# Patient Record
Sex: Male | Born: 1945 | Race: Black or African American | Hispanic: No | Marital: Married | Smoking: Never smoker
Health system: Southern US, Community
[De-identification: ages and names within clinical notes are randomized; demographics above are authoritative.]

## PROBLEM LIST (undated history)

## (undated) ENCOUNTER — Emergency Department (HOSPITAL_BASED_OUTPATIENT_CLINIC_OR_DEPARTMENT_OTHER): Payer: Medicare HMO

## (undated) DIAGNOSIS — C61 Malignant neoplasm of prostate: Secondary | ICD-10-CM

## (undated) DIAGNOSIS — I10 Essential (primary) hypertension: Secondary | ICD-10-CM

## (undated) HISTORY — PX: OTHER SURGICAL HISTORY: SHX169

## (undated) HISTORY — PX: COLON SURGERY: SHX602

## (undated) HISTORY — PX: PROSTATE BIOPSY: SHX241

---

## 2004-02-29 ENCOUNTER — Ambulatory Visit: Payer: Self-pay | Admitting: Internal Medicine

## 2004-03-12 ENCOUNTER — Ambulatory Visit: Payer: Self-pay | Admitting: Internal Medicine

## 2004-12-03 ENCOUNTER — Ambulatory Visit: Payer: Self-pay | Admitting: Internal Medicine

## 2004-12-09 ENCOUNTER — Ambulatory Visit (HOSPITAL_COMMUNITY): Admission: RE | Admit: 2004-12-09 | Discharge: 2004-12-09 | Payer: Self-pay | Admitting: Internal Medicine

## 2005-03-11 ENCOUNTER — Ambulatory Visit: Payer: Self-pay | Admitting: Internal Medicine

## 2005-03-18 ENCOUNTER — Ambulatory Visit: Payer: Self-pay | Admitting: Internal Medicine

## 2005-07-06 ENCOUNTER — Ambulatory Visit: Payer: Self-pay | Admitting: Internal Medicine

## 2005-09-09 ENCOUNTER — Emergency Department (HOSPITAL_COMMUNITY): Admission: EM | Admit: 2005-09-09 | Discharge: 2005-09-10 | Payer: Self-pay | Admitting: Emergency Medicine

## 2005-09-10 ENCOUNTER — Ambulatory Visit: Payer: Self-pay | Admitting: Internal Medicine

## 2007-01-04 ENCOUNTER — Encounter: Payer: Self-pay | Admitting: *Deleted

## 2007-01-04 DIAGNOSIS — F528 Other sexual dysfunction not due to a substance or known physiological condition: Secondary | ICD-10-CM

## 2007-01-04 DIAGNOSIS — I10 Essential (primary) hypertension: Secondary | ICD-10-CM | POA: Insufficient documentation

## 2007-11-14 ENCOUNTER — Encounter: Admission: RE | Admit: 2007-11-14 | Discharge: 2007-11-14 | Payer: Self-pay | Admitting: Gastroenterology

## 2007-11-20 ENCOUNTER — Encounter: Admission: RE | Admit: 2007-11-20 | Discharge: 2007-11-20 | Payer: Self-pay | Admitting: Gastroenterology

## 2007-12-05 ENCOUNTER — Ambulatory Visit: Payer: Self-pay | Admitting: Surgery

## 2008-01-02 ENCOUNTER — Ambulatory Visit: Payer: Self-pay | Admitting: Surgery

## 2008-02-07 ENCOUNTER — Ambulatory Visit (HOSPITAL_COMMUNITY): Admission: RE | Admit: 2008-02-07 | Discharge: 2008-02-07 | Payer: Self-pay | Admitting: Surgery

## 2008-02-07 ENCOUNTER — Ambulatory Visit: Payer: Self-pay | Admitting: Surgery

## 2008-02-20 ENCOUNTER — Encounter: Admission: RE | Admit: 2008-02-20 | Discharge: 2008-02-20 | Payer: Self-pay | Admitting: Surgery

## 2008-02-20 ENCOUNTER — Ambulatory Visit: Payer: Self-pay | Admitting: Surgery

## 2008-02-27 ENCOUNTER — Encounter: Payer: Self-pay | Admitting: Internal Medicine

## 2008-03-05 ENCOUNTER — Ambulatory Visit: Payer: Self-pay | Admitting: Internal Medicine

## 2008-03-05 DIAGNOSIS — E785 Hyperlipidemia, unspecified: Secondary | ICD-10-CM | POA: Insufficient documentation

## 2008-03-05 DIAGNOSIS — I739 Peripheral vascular disease, unspecified: Secondary | ICD-10-CM | POA: Insufficient documentation

## 2008-03-08 ENCOUNTER — Inpatient Hospital Stay (HOSPITAL_COMMUNITY): Admission: RE | Admit: 2008-03-08 | Discharge: 2008-03-11 | Payer: Self-pay | Admitting: Surgery

## 2008-03-08 ENCOUNTER — Ambulatory Visit: Payer: Self-pay | Admitting: Surgery

## 2008-03-09 ENCOUNTER — Encounter: Payer: Self-pay | Admitting: Surgery

## 2008-04-05 ENCOUNTER — Ambulatory Visit: Payer: Self-pay | Admitting: Surgery

## 2008-04-09 ENCOUNTER — Encounter: Admission: RE | Admit: 2008-04-09 | Discharge: 2008-04-09 | Payer: Self-pay | Admitting: Surgery

## 2008-04-09 ENCOUNTER — Ambulatory Visit: Payer: Self-pay | Admitting: Surgery

## 2008-04-17 ENCOUNTER — Ambulatory Visit: Payer: Self-pay | Admitting: Surgery

## 2008-04-17 ENCOUNTER — Ambulatory Visit (HOSPITAL_COMMUNITY): Admission: RE | Admit: 2008-04-17 | Discharge: 2008-04-17 | Payer: Self-pay | Admitting: Surgery

## 2008-05-07 ENCOUNTER — Ambulatory Visit: Payer: Self-pay | Admitting: Surgery

## 2008-05-16 ENCOUNTER — Telehealth: Payer: Self-pay | Admitting: Gastroenterology

## 2008-05-17 ENCOUNTER — Inpatient Hospital Stay (HOSPITAL_COMMUNITY): Admission: RE | Admit: 2008-05-17 | Discharge: 2008-05-23 | Payer: Self-pay | Admitting: Surgery

## 2008-05-17 ENCOUNTER — Ambulatory Visit: Payer: Self-pay | Admitting: Surgery

## 2008-05-18 ENCOUNTER — Encounter: Payer: Self-pay | Admitting: Surgery

## 2008-05-21 ENCOUNTER — Encounter: Payer: Self-pay | Admitting: Surgery

## 2008-06-11 ENCOUNTER — Ambulatory Visit: Payer: Self-pay | Admitting: Surgery

## 2008-06-12 ENCOUNTER — Ambulatory Visit: Payer: Self-pay | Admitting: Internal Medicine

## 2008-06-12 DIAGNOSIS — J069 Acute upper respiratory infection, unspecified: Secondary | ICD-10-CM | POA: Insufficient documentation

## 2008-06-12 DIAGNOSIS — I498 Other specified cardiac arrhythmias: Secondary | ICD-10-CM

## 2008-06-14 LAB — CONVERTED CEMR LAB
ALT: 14 units/L (ref 0–53)
AST: 17 units/L (ref 0–37)
Alkaline Phosphatase: 93 units/L (ref 39–117)
Basophils Absolute: 0.1 10*3/uL (ref 0.0–0.1)
Basophils Relative: 1.6 % (ref 0.0–3.0)
Bilirubin, Direct: 0.1 mg/dL (ref 0.0–0.3)
CO2: 33 meq/L — ABNORMAL HIGH (ref 19–32)
Chloride: 102 meq/L (ref 96–112)
Cholesterol: 189 mg/dL (ref 0–200)
Eosinophils Absolute: 0.1 10*3/uL (ref 0.0–0.7)
GFR calc non Af Amer: 104 mL/min
HDL: 30 mg/dL — ABNORMAL LOW (ref >39.0)
LDL Cholesterol: 133 mg/dL — ABNORMAL HIGH (ref 0–99)
Lymphocytes Relative: 32.7 % (ref 12.0–46.0)
MCHC: 32.7 g/dL (ref 30.0–36.0)
MCV: 83.8 fL (ref 78.0–100.0)
Neutrophils Relative %: 53.7 % (ref 43.0–77.0)
PSA: 1.68 ng/mL (ref 0.10–4.00)
Platelets: 306 10*3/uL (ref 150–400)
Potassium: 3.9 meq/L (ref 3.5–5.1)
RBC: 4.12 M/uL — ABNORMAL LOW (ref 4.22–5.81)
Sodium: 142 meq/L (ref 135–145)
Total Bilirubin: 0.7 mg/dL (ref 0.3–1.2)
VLDL: 26 mg/dL (ref 0–40)

## 2008-06-25 ENCOUNTER — Ambulatory Visit: Payer: Self-pay | Admitting: Surgery

## 2008-07-30 ENCOUNTER — Ambulatory Visit: Payer: Self-pay | Admitting: Surgery

## 2008-08-20 ENCOUNTER — Ambulatory Visit: Payer: Self-pay | Admitting: Surgery

## 2008-11-26 ENCOUNTER — Ambulatory Visit: Payer: Self-pay | Admitting: Surgery

## 2009-02-25 ENCOUNTER — Ambulatory Visit: Payer: Self-pay | Admitting: Surgery

## 2009-03-16 ENCOUNTER — Emergency Department (HOSPITAL_COMMUNITY): Admission: EM | Admit: 2009-03-16 | Discharge: 2009-03-17 | Payer: Self-pay | Admitting: Emergency Medicine

## 2009-06-25 ENCOUNTER — Ambulatory Visit: Payer: Self-pay | Admitting: Vascular Surgery

## 2009-08-11 IMAGING — CT CT ANGIO PELVIS
2 of 5 series · 16 of 46 positions shown, 18 images · IV contrast (omnipaque)
Comparison: 02/20/2008

CTA ABDOMEN

CLINICAL DATA: Postop AAA.

CT ANGIOGRAPHY ABDOMEN AND PELVIS
TECHNIQUE: Multidetector CT imaging of the abdomen and pelvis was
performed using the standard protocol during bolus administration
of intravenous contrast. Multiplanar reconstructed images including
MIPs were obtained and reviewed to evaluate the vascular anatomy.
Contrast: 100 ml Omnipaque 350

[Series 5: angio · axial · 0.62mm/px · z∈[-414,-32]mm · 13 of 248 slices shown, 15 images]
[im 12/248  soft-tissue]
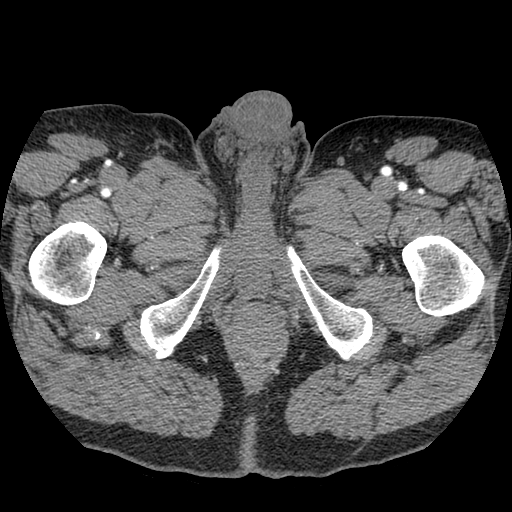
[im 12/248  bone]
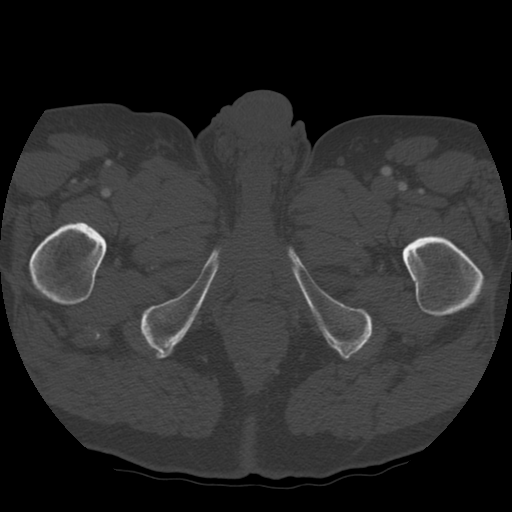
[im 34/248  soft-tissue]
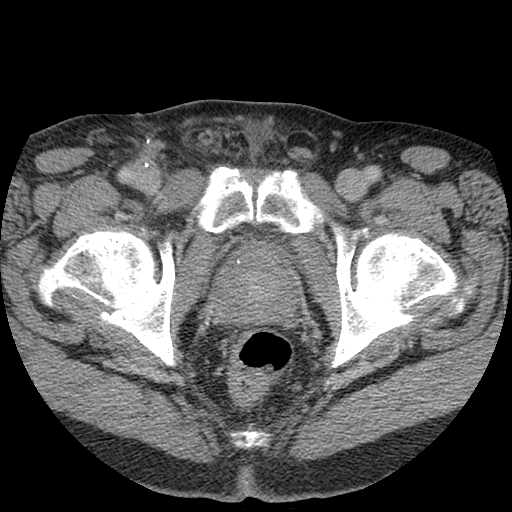
[im 57/248  soft-tissue]
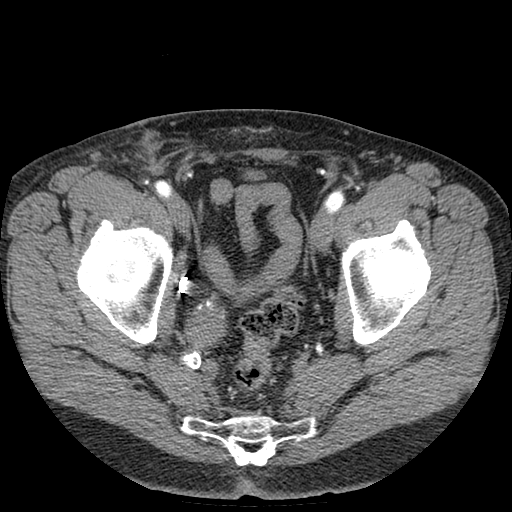
[im 68/248  soft-tissue]
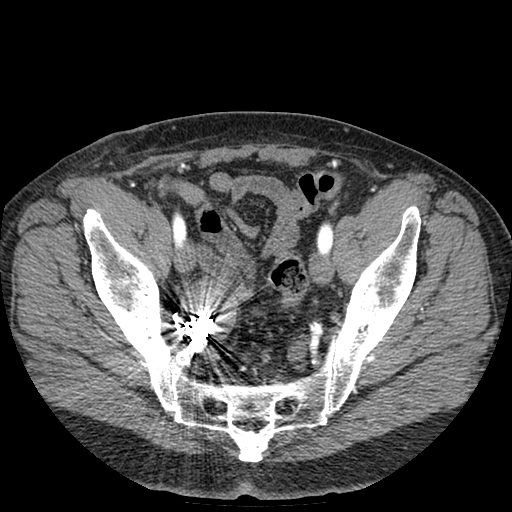
[im 90/248  soft-tissue]
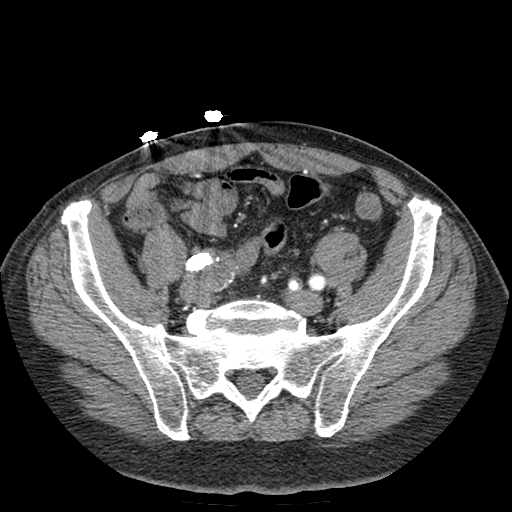
[im 102/248  soft-tissue]
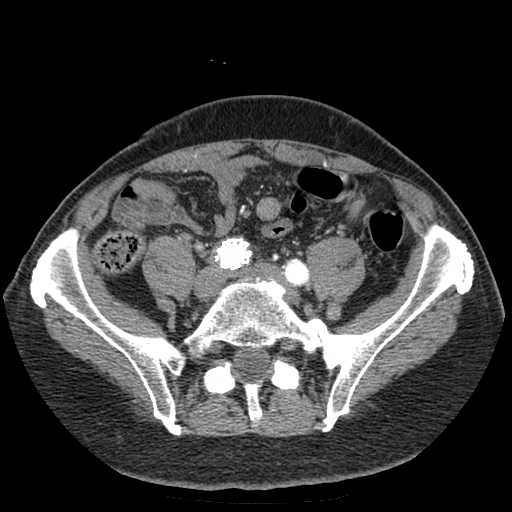
[im 124/248  soft-tissue]
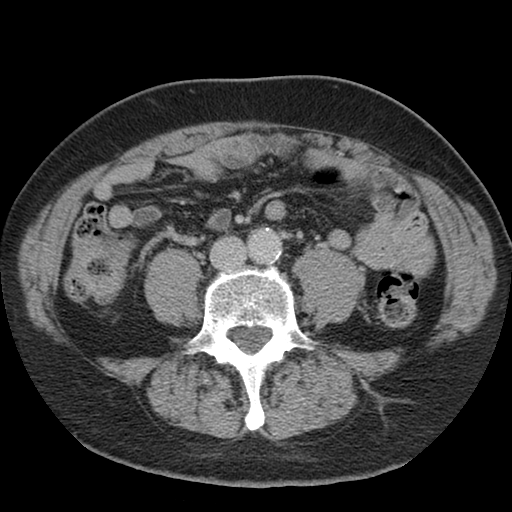
[im 146/248  soft-tissue]
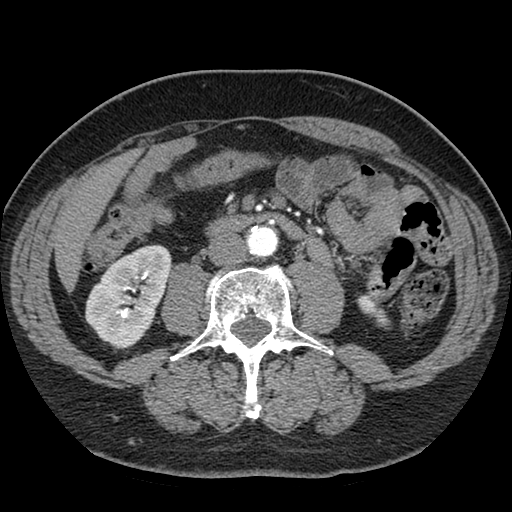
[im 158/248  soft-tissue]
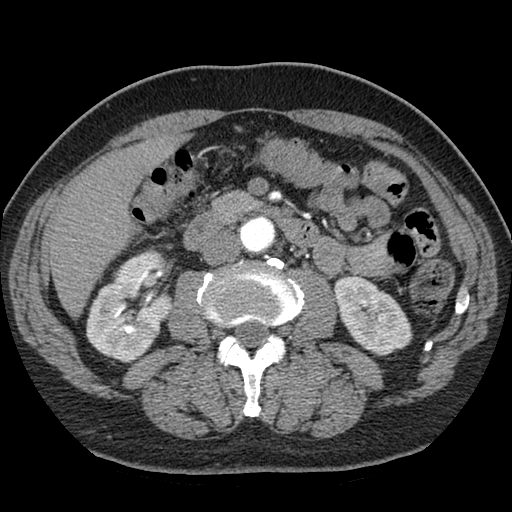
[im 158/248  bone]
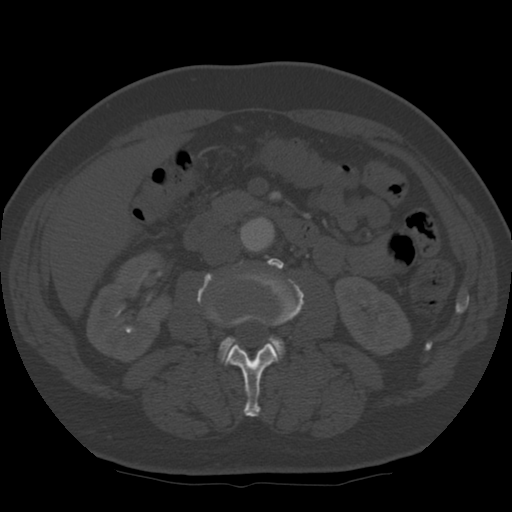
[im 180/248  soft-tissue]
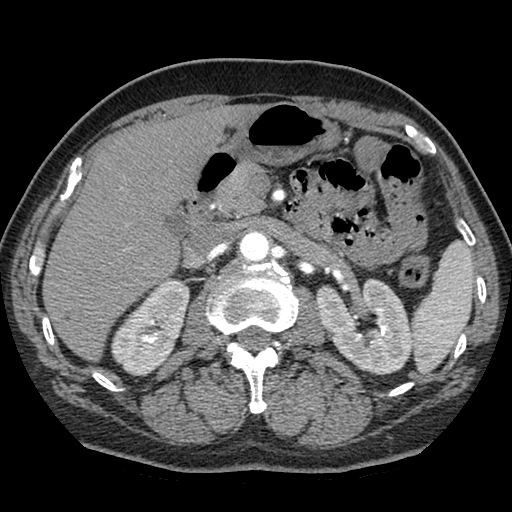
[im 191/248  soft-tissue]
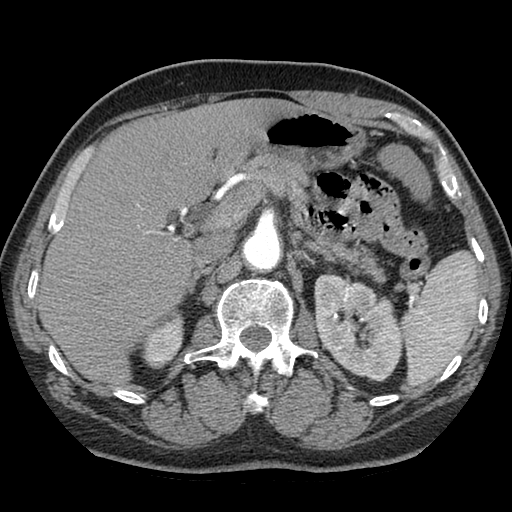
[im 214/248  soft-tissue]
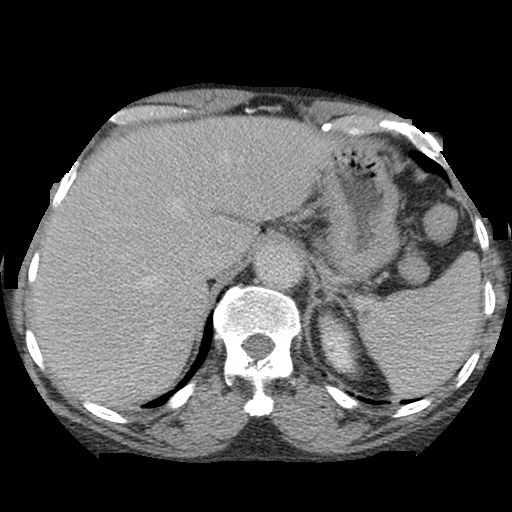
[im 236/248  soft-tissue]
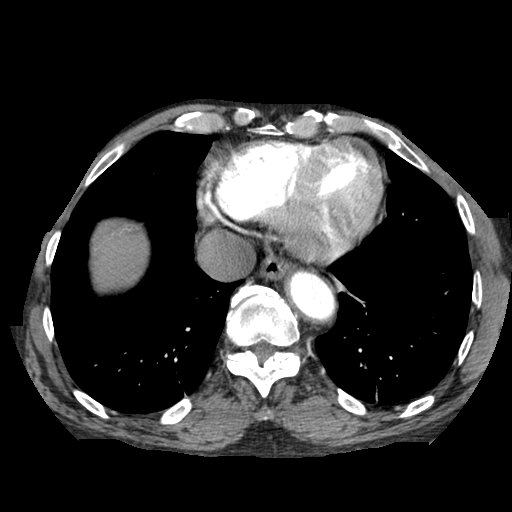

[Series 602: sagittal body · sagittal · 0.90mm/px · 3 of 127 slices shown]
[im 43/127  soft-tissue]
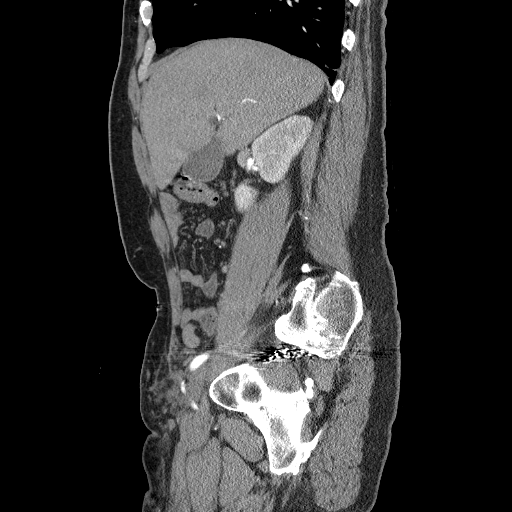
[im 57/127  soft-tissue]
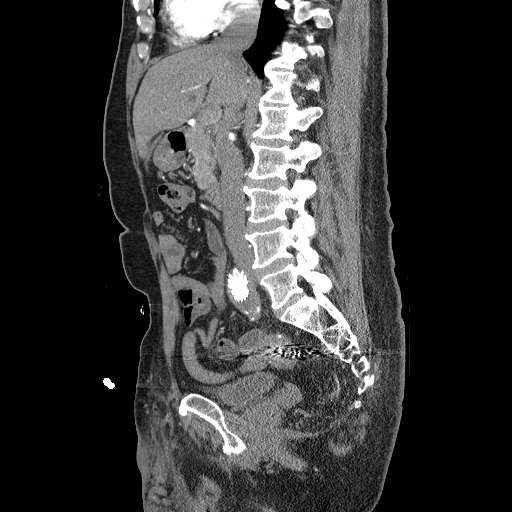
[im 71/127  soft-tissue]
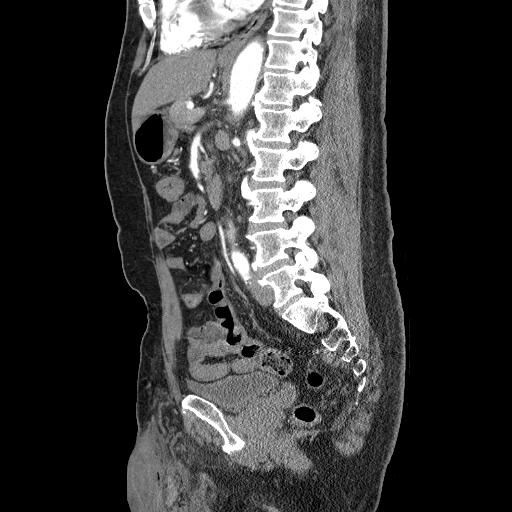

[16 of 46 positions shown; findings below may reference images not displayed]

FINDINGS: Lung bases show subsegmental atelectasis and/or scarring
in the lower lobes.  Heart is at the upper limits of normal in
size.  No pericardial or pleural effusion.

Scattered small low attenuation lesions in the liver are stable,
and likely cysts or hemangiomas, in the absence of known
malignancy. Liver, gallbladder and adrenal glands are otherwise
unremarkable.  There are small nonobstructing stones in the right
kidney.  A 6 mm low attenuation lesion in the left kidney is stable
and likely a cyst.  Spleen, pancreas, stomach and small bowel are
unremarkable.  No pathologically enlarged lymph nodes.  Maximum
diameter of the infrarenal aorta is 2.4 cm.
IMPRESSION: Nonobstructing right nephrolithiasis.

CTA PELVIS
FINDINGS: The patient is status post endovascular repair of a right
common iliac artery aneurysm, which measures 2.5 cm, stable.  There
has been embolization of a right internal iliac artery aneurysm,
with the residual aneurysm sac measuring approximately 3.5 cm,
stable.  A small blush of contrast is seen in the the right
internal iliac artery (image 117).  Right common and external iliac
arteries are patent.

Left internal iliac artery measures up to 1.2 cm.  Left common
iliac artery measures up to 1.3 cm.  Postoperative changes are seen
in the right groin, status post right femoral popliteal artery
bypass.  No postoperative fluid collections.

Colon unremarkable.  Prostate measures 5.4 cm.  No pathologically
enlarged lymph nodes.  No free fluid.  No worrisome lytic or
sclerotic lesions.
IMPRESSION: 1.  Endovascular repair of a right common iliac artery aneurysm
without complicating feature.
2.  Embolization of a right internal iliac artery aneurysm.  A
blush of contrast in the thrombosed internal iliac aneurysm sac may
be due to non embolization of a feeder branch.  Attention on follow-
up exams is warranted.
3.  Postoperative changes in the right groin after right femoral
popliteal artery bypass.
4.  Enlarged prostate.

## 2010-02-25 ENCOUNTER — Emergency Department (HOSPITAL_COMMUNITY): Admission: EM | Admit: 2010-02-25 | Discharge: 2010-02-25 | Payer: Self-pay | Admitting: Emergency Medicine

## 2010-03-03 ENCOUNTER — Ambulatory Visit: Payer: Self-pay | Admitting: Internal Medicine

## 2010-03-03 ENCOUNTER — Encounter (INDEPENDENT_AMBULATORY_CARE_PROVIDER_SITE_OTHER): Payer: Self-pay | Admitting: *Deleted

## 2010-03-03 DIAGNOSIS — M79609 Pain in unspecified limb: Secondary | ICD-10-CM | POA: Insufficient documentation

## 2010-03-03 DIAGNOSIS — S1093XA Contusion of unspecified part of neck, initial encounter: Secondary | ICD-10-CM

## 2010-03-03 DIAGNOSIS — S0083XA Contusion of other part of head, initial encounter: Secondary | ICD-10-CM

## 2010-03-03 DIAGNOSIS — S0003XA Contusion of scalp, initial encounter: Secondary | ICD-10-CM | POA: Insufficient documentation

## 2010-05-08 NOTE — Assessment & Plan Note (Signed)
Summary: post [place]/#/cd--last ov 2010   Vital Signs:  Patient profile:   65 year old male Height:      71 inches Weight:      180.50 pounds BMI:     25.27 O2 Sat:      95 % on Room air Temp:     97.9 degrees F oral Pulse rate:   82 / minute BP sitting:   112 / 72  (left arm) Cuff size:   regular  Vitals Entered By: Zella Ball Ewing CMA Duncan Dull) (March 03, 2010 1:23 PM)  O2 Flow:  Room air  Preventive Care Screening  Colonoscopy:    Date:  02/03/2010    Results:  normal per pt at [facility]   CC: Post Hospital/RE   CC:  Post Hospital/RE.  History of Present Illness: here to f/u after being seen in the ER after an unfortunate slip and fall; striking the right hand adn wrist; as well as right temple with initial sweling adn contusion noted;  asked to f/u here by the ER;  normally seen at the [facility] for usual care, not really trying to follow lower chol diet at all;  Pt denies CP, worsening sob, doe, wheezing, orthopnea, pnd, worsening LE edema, palps, dizziness or syncope  Pt denies new neuro symptoms such as headache, facial or extremity weakness Pt denies polydipsia, polyuria   Overall good compliance with meds, wt stable, little excercise however .  No fever, wt loss, night sweats, loss of appetite or other constitutional symptoms No overt bleeding or other bruising.  Denies worsening depressive symptoms, suicidal ideation, or panic.    Preventive Screening-Counseling & Management      Drug Use:  no.    Problems Prior to Update: 1)  Contusion, Head  (ICD-920) 2)  Hand Pain, Right  (ICD-729.5) 3)  Supraventricular Tachycardia  (ICD-427.89) 4)  Uri  (ICD-465.9) 5)  Preventive Health Care  (ICD-V70.0) 6)  Hyperlipidemia  (ICD-272.4) 7)  Pvd  (ICD-443.9) 8)  Erectile Dysfunction  (ICD-302.72) 9)  Hypertension  (ICD-401.9)  Medications Prior to Update: 1)  Diovan Hct 160-12.5 Mg  Tabs (Valsartan-Hydrochlorothiazide) .Marland Kitchen.. 1 Once Daily 2)  Adult Aspirin Low Strength 81 Mg  Tbdp  (Aspirin) .Marland Kitchen.. 1 Once Daily 3)  Multivitamins   Tabs (Multiple Vitamin) .Marland Kitchen.. 1 Once Daily 4)  Viagra 100 Mg  Tabs (Sildenafil Citrate) .... As Needed 5)  Oxycodone-Acetaminophen 5-325 Mg Tabs (Oxycodone-Acetaminophen) .Marland Kitchen.. 1 Every 4 Hours As Needed For Pain 6)  Hydrochlorothiazide 25 Mg Tabs (Hydrochlorothiazide) .Marland Kitchen.. 1 By Mouth Once Daily 7)  Simvastatin 80 Mg Tabs (Simvastatin) .Marland Kitchen.. 1 By Mouth Once Daily  Current Medications (verified): 1)  Diovan Hct 160-12.5 Mg  Tabs (Valsartan-Hydrochlorothiazide) .Marland Kitchen.. 1 Once Daily 2)  Adult Aspirin Low Strength 81 Mg  Tbdp (Aspirin) .Marland Kitchen.. 1 Once Daily 3)  Multivitamins   Tabs (Multiple Vitamin) .Marland Kitchen.. 1 Once Daily 4)  Viagra 100 Mg  Tabs (Sildenafil Citrate) .... As Needed  Allergies (verified): 1)  ! Pravachol  Past History:  Past Medical History: Last updated: 03/05/2008 Hyperlipidemia Hypertension c-spine DJD E.D.  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   Head:  normocephalic and atraumatic.   Eyes:  No corneal or conjunctival inflammation noted. EOMI. Perrla. Ears:  R ear normal and L ear normal.   Nose:  no external deformity and no nasal discharge.   Mouth:  no gingival abnormalities and pharynx pink and moist.   Neck:  supple and no masses.   Lungs:  normal respiratory effort and normal breath sounds.   Heart:  normal rate and regular rhythm.   Msk:  right wrist with  only slightly decrased ROM and swelling, right hand trace swelling diffusely with mild contusion as well, no laceration and o/w neurovasc intact;  right temple with several day old mild contusion as well  Extremities:  no edema, no erythema  Neurologic:  cranial nerves II-XII intact, strength normal in all extremities, and gait normal.     Impression & Recommendations:  Problem # 1:  HAND PAIN, RIGHT (ICD-729.5) Assessment New wrist and hand contusion , functionally improving, and neurovasc intact - ok to follow as is;  note for work given -   Problem # 2:  CONTUSION, HEAD (ICD-920) Assessment: New mild, neuro exam ok;  no further eval needed  Problem # 3:  HYPERTENSION (ICD-401.9) Assessment: Unchanged  The following medications were removed from the medication list:    Hydrochlorothiazide 25 Mg Tabs (Hydrochlorothiazide) .Marland Kitchen... 1 by mouth once daily His updated medication list for this problem includes:    Diovan Hct 160-12.5 Mg Tabs (Valsartan-hydrochlorothiazide) .Marland Kitchen... 1 once daily  BP today: 112/72 Prior BP: 140/68 (06/12/2008)  Labs Reviewed: K+: 3.9 (06/12/2008) Creat: : 0.8 (06/12/2008)   Chol: 189 (06/12/2008)   HDL: 30.0 (06/12/2008)   LDL: 133 (06/12/2008)   TG: 131 (06/12/2008) stable overall by hx and exam, ok to continue meds/tx as is   Problem # 4:  HYPERLIPIDEMIA (ICD-272.4)  The following medications were removed from the medication list:    Simvastatin 80 Mg Tabs (Simvastatin) .Marland Kitchen... 1 by mouth once daily  Labs Reviewed: SGOT: 17 (06/12/2008)   SGPT: 14 (06/12/2008)   HDL:30.0 (06/12/2008)  LDL:133 (06/12/2008)  Chol:189 (06/12/2008)  Trig:131 (06/12/2008) d/w pt - goal LDL < 70 given the PVD, but he declines statin, [facility]  Complete Medication List: 1)  Diovan Hct 160-12.5 Mg Tabs (Valsartan-hydrochlorothiazide) .Marland Kitchen.. 1 once daily 2)  Adult Aspirin Low Strength 81 Mg Tbdp (Aspirin) .Marland Kitchen.. 1 once daily 3)  Multivitamins Tabs (Multiple  vitamin) .Marland Kitchen.. 1 once daily 4)  Viagra 100 Mg Tabs (Sildenafil citrate) .... As needed  Patient Instructions: 1)  Continue all previous medications as before this visit  2)  you are given the work note 3)  Please schedule a follow-up appointment as needed.   Orders Added: 1)  Est. Patient Level IV [65784]

## 2010-05-08 NOTE — Letter (Signed)
Summary: Out of Work  [facility]  [address] [place], [place] [address]   Phone: [phone]  Fax: [phone]    March 03, 2010   Employee:  Joshua Cain    To Whom It May Concern:   For Medical reasons, please excuse the above named employee from work for the following dates:  Start:   02/25/2010  End:   03/04/2010  If you need additional information, please feel free to contact our office.         Sincerely,    Dr. Oliver Barre

## 2010-06-17 LAB — POCT CARDIAC MARKERS
CKMB, poc: 1.1 ng/mL (ref 1.0–8.0)
CKMB, poc: 3.6 ng/mL (ref 1.0–8.0)
Myoglobin, poc: 409 ng/mL (ref 12–200)
Myoglobin, poc: >500 ng/mL (ref 12–200)

## 2010-06-17 LAB — BASIC METABOLIC PANEL
BUN: 18 mg/dL (ref 6–23)
Chloride: 105 mEq/L (ref 96–112)
GFR calc non Af Amer: >60 mL/min (ref >60)
Glucose, Bld: 122 mg/dL — ABNORMAL HIGH (ref 70–99)
Potassium: 3.2 mEq/L — ABNORMAL LOW (ref 3.5–5.1)
Sodium: 141 mEq/L (ref 135–145)

## 2010-06-17 LAB — CK TOTAL AND CKMB (NOT AT ARMC)
CK, MB: 9.4 ng/mL (ref 0.3–4.0)
Total CK: 534 U/L — ABNORMAL HIGH (ref 7–232)

## 2010-06-17 LAB — CBC
HCT: 41.8 % (ref 39.0–52.0)
Hemoglobin: 14.3 g/dL (ref 13.0–17.0)
MCHC: 34.2 g/dL (ref 30.0–36.0)
MCV: 87.8 fL (ref 78.0–100.0)
RDW: 13.2 % (ref 11.5–15.5)

## 2010-07-08 LAB — URINE MICROSCOPIC-ADD ON

## 2010-07-08 LAB — URINALYSIS, ROUTINE W REFLEX MICROSCOPIC
Bilirubin Urine: NEGATIVE
Specific Gravity, Urine: 1.017 (ref 1.005–1.030)
Urobilinogen, UA: 0.2 mg/dL (ref 0.0–1.0)

## 2010-07-21 LAB — POCT I-STAT, CHEM 8
Creatinine, Ser: 1.1 mg/dL (ref 0.4–1.5)
HCT: 36 % — ABNORMAL LOW (ref 39.0–52.0)
Hemoglobin: 12.2 g/dL — ABNORMAL LOW (ref 13.0–17.0)
Potassium: 4 mEq/L (ref 3.5–5.1)
Sodium: 139 mEq/L (ref 135–145)

## 2010-07-22 LAB — POCT I-STAT 4, (NA,K, GLUC, HGB,HCT)
Glucose, Bld: 109 mg/dL — ABNORMAL HIGH (ref 70–99)
HCT: 28 % — ABNORMAL LOW (ref 39.0–52.0)
Hemoglobin: 9.5 g/dL — ABNORMAL LOW (ref 13.0–17.0)
Potassium: 3 mEq/L — ABNORMAL LOW (ref 3.5–5.1)
Sodium: 138 mEq/L (ref 135–145)

## 2010-07-22 LAB — CBC
HCT: 24 % — ABNORMAL LOW (ref 39.0–52.0)
Hemoglobin: 10.8 g/dL — ABNORMAL LOW (ref 13.0–17.0)
Hemoglobin: 13.1 g/dL (ref 13.0–17.0)
MCHC: 33.3 g/dL (ref 30.0–36.0)
MCHC: 33.3 g/dL (ref 30.0–36.0)
Platelets: 204 10*3/uL (ref 150–400)
RBC: 3.87 MIL/uL — ABNORMAL LOW (ref 4.22–5.81)
RBC: 4.74 MIL/uL (ref 4.22–5.81)
RDW: 15.2 % (ref 11.5–15.5)
WBC: 4.7 10*3/uL (ref 4.0–10.5)
WBC: 4.9 10*3/uL (ref 4.0–10.5)
WBC: 9.1 10*3/uL (ref 4.0–10.5)

## 2010-07-22 LAB — COMPREHENSIVE METABOLIC PANEL
ALT: 14 U/L (ref 0–53)
AST: 18 U/L (ref 0–37)
Alkaline Phosphatase: 101 U/L (ref 39–117)
CO2: 28 mEq/L (ref 19–32)
Chloride: 105 mEq/L (ref 96–112)
GFR calc non Af Amer: >60 mL/min (ref >60)
Glucose, Bld: 96 mg/dL (ref 70–99)
Potassium: 4.2 mEq/L (ref 3.5–5.1)
Sodium: 141 mEq/L (ref 135–145)
Total Bilirubin: 0.6 mg/dL (ref 0.3–1.2)

## 2010-07-22 LAB — BASIC METABOLIC PANEL
BUN: 6 mg/dL (ref 6–23)
CO2: 28 mEq/L (ref 19–32)
Calcium: 7.4 mg/dL — ABNORMAL LOW (ref 8.4–10.5)
Calcium: 8.4 mg/dL (ref 8.4–10.5)
Chloride: 109 mEq/L (ref 96–112)
Creatinine, Ser: 0.71 mg/dL (ref 0.4–1.5)
Creatinine, Ser: 0.84 mg/dL (ref 0.4–1.5)
GFR calc Af Amer: >60 mL/min (ref >60)
GFR calc non Af Amer: >60 mL/min (ref >60)
Glucose, Bld: 121 mg/dL — ABNORMAL HIGH (ref 70–99)
Sodium: 142 mEq/L (ref 135–145)

## 2010-07-22 LAB — POCT I-STAT 3, ART BLOOD GAS (G3+)
Bicarbonate: 26.3 mEq/L — ABNORMAL HIGH (ref 20.0–24.0)
Patient temperature: 98.1
TCO2: 27 mmol/L (ref 0–100)
pCO2 arterial: 38 mmHg (ref 35.0–45.0)
pH, Arterial: 7.447 (ref 7.350–7.450)

## 2010-07-22 LAB — URINALYSIS, ROUTINE W REFLEX MICROSCOPIC
Glucose, UA: NEGATIVE mg/dL
Hgb urine dipstick: NEGATIVE
Ketones, ur: NEGATIVE mg/dL
Protein, ur: NEGATIVE mg/dL
pH: 6 (ref 5.0–8.0)

## 2010-07-22 LAB — PROTIME-INR: Prothrombin Time: 14 seconds (ref 11.6–15.2)

## 2010-07-22 LAB — HEPARIN LEVEL (UNFRACTIONATED): Heparin Unfractionated: <0.1 IU/mL — ABNORMAL LOW (ref 0.30–0.70)

## 2010-07-22 LAB — TYPE AND SCREEN

## 2010-08-19 NOTE — Procedures (Signed)
DUPLEX DEEP VENOUS EXAM - LOWER EXTREMITY   INDICATION:  Pain.   HISTORY:  Edema:  Yes.  Trauma/Surgery:  Right greater saphenous vein ablation.  Pain:  No.  PE:  No.  Previous DVT:  None.  Anticoagulants:  No.  Other:  No.   DUPLEX EXAM:                CFV   SFV   PopV  PTV    GSV                R  L  R  L  R  L  R   L  R  L  Thrombosis    o  o  o     o     o  Spontaneous   +  +  +     +     +  Phasic        +  +  +     +     +  Augmentation  +  +  +     +     +  Compressible  +  +  +     +     +  Competent     +  +  +     +     +   Legend:  + - yes  o - no  p - partial  D - decreased   IMPRESSION:  No evidence of deep venous thrombosis in the right leg.    _____________________________  Quita Skye Hart [place], M.D.   CB/MEDQ  D:  06/25/2009  T:  06/26/2009  Job:  [id number]

## 2010-08-19 NOTE — Op Note (Signed)
NAME:  Joshua Cain, Joshua Cain               ACCOUNT NO.:  [id number]   MEDICAL RECORD NO.:  [id number]          PATIENT TYPE:  AMB   LOCATION:  SDS                          FACILITY:  [facility]   PHYSICIAN:  Juleen China IV, MDDATE OF BIRTH:  Jan 25, 1946   DATE OF PROCEDURE:  04/17/2008  DATE OF DISCHARGE:  04/17/2008                               OPERATIVE REPORT   PREOPERATIVE DIAGNOSIS:  Right leg claudication.   POSTOPERATIVE DIAGNOSIS:  Right leg claudication.   PROCEDURE PERFORMED:  1. Ultrasound-guided access, left common femoral artery.  2. Abdominal aortogram.  3. Right lower extremity runoff.  4. Second order catheterization.   INDICATIONS:  This is a 65 year old gentleman with persistent sciatic  artery.  He has undergone embolization of his sciatic artery aneurysm  and stent graft into his common and external iliac artery on the right.  At the same time, he underwent right femoral to below-knee popliteal  bypass graft with vein.  He comes back in the office and is complaining  of claudication.  Ultrasound reveals decreased ankle-brachial indices.  He comes in today for arteriogram.   PROCEDURE:  The patient identified in the holding are and taken to room  #80.  He was placed supine on the table.  The bilateral groins were  prepped and draped in the standard sterile fashion.  Time-out was  called.  Left femoral artery was evaluated with ultrasound and found to  be widely patent.  Lidocaine 1% was used for local anesthesia.  Left  femoral artery was accessed under ultrasound guidance with an 18-gauge  needle.  A 0.035 Bentson wire was advanced in a retrograde fashion into  the abdominal aorta under fluoroscopic visualization.  A 5-French sheath  was placed.  Over the wire, an Omni flush catheter was placed just above  the aortic bifurcation and aortic angiogram was obtained.  Next, using  the Omni flush catheter and Glidewire, the aortic bifurcation was  crossed.  Cath was  placed in the right external iliac artery and right  lower extremity runoff was obtained.   FINDINGS:  Aortogram:  Stent graft is situated in good position at the  origin of the right common iliac artery.  Coils are visualized within  the right sciatic artery.  There is no filling of the aneurysm sac.  Right external iliac artery is patent in its visualized portions.   Right lower extremity:  The right common femoral artery is patent.  The  right profunda femoral artery is patent with multiple collaterals.  The  right femoral to popliteal bypass graft is occluded.  Collaterals from  the profunda femoral artery reconstitute the below-knee popliteal  artery.  Difficult to evaluate the tibial vessels, however, the  posterior tibial and anterior tibial artery appear patent.   After above images were obtained, decision was made to terminate the  procedure.  Catheters and wires were removed.  The patient will be taken  to the holding area for sheath pull.   IMPRESSION:  1. Successful embolization and exclusion of sciatic artery aneurysm.  2. Occluded femoral popliteal bypass graft.  ______________________________  V. Charlena Cross, MD  Electronically Signed     VWB/MEDQ  D:  04/17/2008  T:  04/17/2008  Job:  [id number]

## 2010-08-19 NOTE — Assessment & Plan Note (Signed)
OFFICE VISIT   JESTEN, CAPPUCCIO  DOB:  1945/08/09                                       02/20/2008  [id number]#:[id number]   REASON FOR VISIT:  Follow up persistent sciatic artery.   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  He was taken for  arteriogram to perform what was going to be part of a 2-stage procedure  to treat his hypogastric aneurysm.  Upon his diagnostic arteriogram, he  was found have a persistent sciatic artery, which precluded embolization  of his hypogastric artery.  I had sent him for CT scan as well as vein  mapping prior to today's visit.  He comes in today for further  discussions.  He has had no change in his symptoms since I last saw him.   PHYSICAL EXAMINATION:  His blood pressure is 136/79, pulse 106,  temperature 98.6.  He is well appearing, no distress.  Cardiovascular:  Regular rate and rhythm.  No murmurs or gallops.  Pulmonary:  Clear  bilaterally.  Abdomen:  Soft, nontender.  Extremities are well perfused.   PAST MEDICAL HISTORY:  Hypertension.   PAST SURGICAL HISTORY:  Left inguinal hernia.   FAMILY HISTORY:  Negative for cardiovascular disease.   SOCIAL:  He is married with 2 children.  Does not smoke and never  smoked.   MEDICATIONS:  Include hydrochlorothiazide/lisinopril.  Alprazolam p.r.n.   ALLERGIES:  None.   DIAGNOSTIC STUDIES:  Vein mapping:  The patient's lower extremity veins  are suboptimal for bypass.  His cephalic veins are marked bilaterally.  The right measures 0.31-0.48, the left measures 0.31-0.36 below the  antecubital crease.  He has a narrowing above the antecubital crease.   CT scan:  I have extensively reviewed a CT scan.   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   PLAN:  I had a long conversation discussing the multiple options.  I  think our  best option is going to be ligation of his right hypogastric  artery aneurysm and then proceeding with below knee popliteal artery  bypass with cephalic vein from his right arm and possibly from his right  leg as a spliced conduit.  At the time of ligation of his aneurysm, I  could potentially do a common iliac to his sciatic artery bypass which  would alleviate having to perform a distal bypass.  That decision will  need to be made in the operating room.  The patient understands all  this.  His operation has been scheduled for Thursday, December 3rd.  The  patient is to have a Cardiolite study prior to his operation.   Jorge Ny, MD  Electronically Signed   VWB/MEDQ  D:  02/20/2008  T:  02/21/2008  Job:  [id number]   cc:   Jordan Hawks. Elnoria Howard, MD  Osvaldo Shipper. Spruill, M.D.

## 2010-08-19 NOTE — Assessment & Plan Note (Signed)
OFFICE VISIT   YANG, RACK  DOB:  Aug 24, 1945                                       06/25/2009  [id number]#:[id number]   HISTORY OF PRESENT ILLNESS:  Mr. Brandenburg is an 65 year old gentleman with  right fem-pop occlusive disease who had a right fem-pop bypass with  greater saphenous vein in January of 2010.  This occluded and he had  right fem-pop with arm pain, which also occluded in February of 2010.  Three days later this occluded, and he had a right below-knee pop with 6  mm Propaten graft.  His last ABIs in November of 2010 showed that the  bypass graft was occluded.  He had ABIs of 0.63 on the right and 0.95 on  the left with a palpable DP pulse on the left.   The patient states that last Friday he was getting out of his car and  then when he went to step on his right leg, he felt like he would give  way.  He had a couple days of some leg swelling, which would be  decreased in the morning after he slept overnight.  He denies any calf  tightness.  He states he had some thigh weakness and tiredness after  walking approximately 4 blocks.  He says he has difficulty sleeping on  one side or the other but has no night or rest pain.  He denies any hip  or buttock pain.  He has no lesions on his right lower extremity.  His  feet are not numb or tingling or cold.  Of note, the patient also had  embolization of a right hypogastric artery aneurysm and right common  iliac artery aneurysm.   VASCULAR LAB:  Repeat ABIs were essentially unchanged with 0.7 on the  right and 0.9 on the left.  He had monophasic signal on the posterior  tib and dorsalis pedis on the right and a palpable left DP.   He also had an ultrasound to rule out DVT, and this was also negative.   PHYSICAL EXAMINATION:  This is a well-developed, well-nourished  gentleman in no acute distress.  His heart rate was 82.  His sats were  98%.  His respiratory rate was 12.  He walked with a slight limp when  he  first got up from a chair.  Bilateral lower extremities were of equal  temperature and warm.  He had some tenderness and paresthesias in the  medial thigh but no calf pain.  He had 1+ pitting edema on the right  side.  His right knee was also examined.  He had negative McMurray's.  He had negative joint line pain and negative patella femoral  compression.  He had good and equal sensation.  He had no open wounds on  either lower extremity.  All his wounds were healing well.  There was no  redness or erythema around any of the wounds.   ASSESSMENT:  Right leg fatigue and claudication with swelling in the  dependent position in the right lower extremity.  Deep venous thrombosis  and decreased arterial flow were tested for today.  Ultrasound for deep  venous thrombosis was negative.  ABIs were stable.   PLAN:  The patient was advised to look for any changes in temperature,  sensation, motion of his right lower extremity as well as increased  pain.  If any of these symptoms should occur, we will see him again and  do further evaluation.   Della Goo, PA-C   Quita Skye. Hart [place], M.D.  Electronically Signed   RR/MEDQ  D:  06/25/2009  T:  06/26/2009  Job:  [id number]

## 2010-08-19 NOTE — Procedures (Signed)
BYPASS GRAFT EVALUATION   INDICATION:  Followup right femoral to popliteal artery bypass graft.   HISTORY:  Diabetes:  No  Cardiac:  No  Hypertension:  Yes  Smoking:  No  Previous Surgery:  Right redo of femoral to popliteal artery bypass  graft on 05/18/2008 by Dr. Myra Gianotti.   SINGLE LEVEL ARTERIAL EXAM                               RIGHT              LEFT  Brachial:                    143                147  Anterior tibial:             88                 133  Posterior tibial:            93                 140  Peroneal:  Ankle/brachial index:        0.63               0.95   PREVIOUS ABI:  Date: 11/26/2008  RIGHT:  0.96  LEFT:  1.12   LOWER EXTREMITY BYPASS GRAFT DUPLEX EXAM:   DUPLEX:  Duplex of right femoral to below popliteal artery bypass graft  appears occluded.   IMPRESSION:  1. Significant drop in right ankle-brachial index with monophasic      waveforms that suggest moderate arterial disease.  2. Stable left ankle-brachial index from previous study.  3. Occluded right femoral to popliteal artery bypass graft.    ___________________________________________  V. Charlena Cross, MD   CB/MEDQ  D:  02/25/2009  T:  02/26/2009  Job:  [id number]

## 2010-08-19 NOTE — Assessment & Plan Note (Signed)
OFFICE VISIT   ELLARD, NAN  DOB:  [birth year]                                       05/07/2008  [id number]#:[id number]   This is a 65 year old gentleman who initially presented with a  persistent sciatic artery and aneurysmal degeneration of his right  internal iliac artery.  On December 3 he underwent endovascular  exclusion of his iliac aneurysm via coil embolization of his hypogastric  artery and persistent sciatic artery and covered stenting of his iliac  arterial system.  Following this procedure he underwent right femoral to  below knee popliteal artery bypass graft with ipsilateral nonreversed  greater saphenous vein.  He had an uncomplicated postoperative course  and was discharged home soon after his procedure.   When I saw him in the office his ankle brachial indices on the right  were 0.6 compared to 1.0 on the left.  Duplex was concerning for  tortuosity/stenosis in the distal bypass graft.  On January 12 he was  taken for arteriogram.  Angiographic findings included a successful  exclusion of his hypogastric aneurysm.  However, the patient's bypass  graft was not visible, presumed occluded.  The patient comes back in  today for further discussion.  He states that he is having persistent  claudication and as well as the feeling of pins and needles in his foot  at night.  These do not last long and are alleviated by resting his foot  over the bed.   PAST MEDICAL HISTORY:  Hypertension and persistent sciatic artery.   PAST SURGICAL HISTORY:  Left inguinal hernia and persistent sciatic  artery repair.   FAMILY HISTORY:  Negative for coronary vascular disease.   SOCIAL HISTORY:  Married with two children.  Does not smoke.  Does not  drink.   MEDICATIONS:  1. Include hydrochlorothiazide/lisinopril.  2. Alprazolam.   ALLERGIES:  None.   PHYSICAL EXAMINATION:  General:  He is well-appearing and in no  distress.  Vital signs:  Blood pressure is  151/78, pulse is 101.  Cardiovascular:  Regular rate and rhythm.  Respirations:  Nonlabored.  Abdomen:  Soft.  Extremities:  Are warm and well-perfused.  His  incisions are well-healed.   PLAN:  Due to the patient's symptomatology I feel like we need to redo  his bypass graft.  I feel it most likely did not stay open secondary to  vein caliber.  The patient did have some atherosclerotic disease within  his below knee popliteal artery.  However, I feel like most likely this  was a vein problem not an arterial problem.  The patient does have  adequate arm vein.  I think his right cephalic vein is better than his  left and so I will plan on proceeding with a right cephalic vein harvest  and redo femoral below knee popliteal artery bypass graft.  This has  been scheduled for Thursday February 11.   Jorge Ny, MD  Electronically Signed   VWB/MEDQ  D:  05/07/2008  T:  05/09/2008  Job:  [id number]

## 2010-08-19 NOTE — Op Note (Signed)
NAMEJOURDIN, Joshua Cain               ACCOUNT NO.:  [id number]   MEDICAL RECORD NO.:  [id number]          PATIENT TYPE:  INP   LOCATION:  3309                         FACILITY:  [facility]   PHYSICIAN:  Juleen China IV, MDDATE OF BIRTH:  02-10-1946   DATE OF PROCEDURE:  03/08/2008  DATE OF DISCHARGE:                               OPERATIVE REPORT   SURGEON:  1. Durene Cal IV, MD   ASSISTANTS:  1. Larina Earthly, MD  2. 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   POSTOPERATIVE DIAGNOSIS:  Persistent right sciatic artery with  aneurysmal degeneration of the right hypogastric and right common iliac  artery.   PROCEDURES PERFORMED:  1. Right femoral-to-below-knee popliteal artery bypass with      nonreversed ipsilateral translocated greater saphenous vein.  2. Ultrasound-guided access, left common femoral artery.  3. Abdominal aortogram.  4. Pelvic angiogram.  5. Third order catheterization x2 (secondary hypogastric artery      branch).  6. Followup angiogram x2.  7. Embolization of the right hypogastric artery and secondary      branches.  8. Endovascular repair of right hypogastric and right common iliac      aneurysm.  9. Retrograde left femoral angiogram.  10.Closure device (StarClose).   ANESTHESIA:  General.   ESTIMATED BLOOD LOSS:  100 mL.   DRAINS:  15 Blake.   SPECIMENS:  None.   CULTURES:  None.   INDICATIONS:  Joshua Cain is a 65 year old gentleman who was found to have  a persistent right sciatic artery.  He has an aneurysmal degeneration of  his right hypogastric artery as well as his right common iliac artery.  He comes in for a definitive repair.   PROCEDURE:  The patient was identified in the holding area and taken to  room #9.  He was placed supine on the table.  General endotracheal  anesthesia was administered.  He was  prepped and draped in the standard  sterile fashion.  I first began exposing the profunda femoral artery in  the groin, which was in the location of the common femoral artery.  Cautery was used to dissect the subcutaneous tissue.  The femoral sheath  was opened sharply.  The femoral artery was mobilized from the inguinal  ligament for approximately 3 cm.  Next, an incision medial leg below the  knee was made for exposure of the below-knee popliteal artery.  Saphenous vein was also identified in this location.  It was found to be  of adequate diameter to use for bypass.  The greater saphenous vein was  harvested through skip incisions of the leg.  Side branches were divided  with 2-0 silk ties.  Once the vein had been then fully mobilized, the  saphenofemoral junction was oversewn with 5-0 Prolene.  The distal end  was ligated with 2-0 silk.  The vein was then placed in the heparinized  saline on the back table.   Next, we started with the endovascular portion of the  procedure.  Left  femoral artery was evaluated by ultrasound and found to be widely  patent.  It was accessed with an 18-gauge needle.  A Bentson wire was  advanced into the aorta under fluoroscopic visualization.  A 7-French 45-  cm Terumo sheath was advanced into the proximal left iliac artery.  Aortogram was then performed to identify the bifurcation.  Using an Omni  flush catheter and a Bentson wire, the aortic bifurcation was crossed.  Wire was then advanced down into the right femoral artery.  The catheter  followed as we were unable to immediately advance the sheath into the  common iliac artery.  I used a Kumpe catheter and a Bentson wire to  cannulate the right hypogastric artery.  The sheath was then advanced  into the hypogastric artery.  Using a Kumpe catheter and a Bentson wire,  the sciatic artery was first selected.  The sheath was then advanced  over the catheter into the sciatic artery.  An angiogram was  performed  to confirm successful cannulation.  I then used a 14-mm Amplatzer plug  to occlude the proximal sciatic artery.  Once this was done, an  arteriogram was performed, which revealed flow to the sciatic artery;  however, it was sluggish.  Next, the Kumpe catheter and Glidewire were  used to select another hypogastric branch feeding the aneurysm.  Once  this was cannulated again, the sheath was advanced into the branch and a  10-mm Amplatzer plug was placed.  Followup arteriogram was performed,  which revealed successful embolization of this branch of the hypogastric  artery.  I then placed 3 additional 12-mm Nestor coils into the  hypogastric aneurysm.  Followup angiogram revealed persistent patent  branch.  This branch was cannulated with a Kumpe catheter, which was  advanced out into the branch and a 6-mm Nestor coil was deployed.  The  sheath was then withdrawn into the hypogastric aneurysm, and a final 12-  mm Nestor coil was placed into the aneurysm.  A final followup  arteriogram was performed, which revealed successful embolization of the  hypogastric aneurysm and its branches.  At this point, the sheath was  withdrawn into the aorta.  The right femoral artery was then cannulated  with an 18-gauge needle.  A Bentson wire was advanced into the aorta  under fluoroscopic visualization.  An 8-French sheath was placed.  Kumpe  catheter was advanced over the wire.  Bentson wire was removed, and an  Amplatz Super Stiff wire was advanced into the descending thoracic aorta  under fluoroscopic visualization.  Multiple fluoroscopic images were  performed to identify the aortic bifurcation.  A 22 x 56 Cook leg  extension was prepared on the back table.  This was flipped around on  the back table and then re-sheathed.  Fluoroscopy was used to confirm  that the flipped leg was in good position.  The sheath containing the  leg was then advanced over the Amplatzer wire and positioned at  the  aortic bifurcation.  Contrast injections were performed through the  sheath in the aorta to identify the landing zone of the flipped iliac  leg.  This was then successfully deployed landing into the external  iliac artery.  A CODA balloon was then used to mold the stent.  A  retrograde and antegrade arteriogram was performed, which revealed good  exclusion of the aneurysm and patent stent graft.  I was concerned,  however, with a luminal narrowing just distal to the takeoff  of the  hypogastric artery.  This was ballooned with a 10 x 4 balloon taken to  12 atmospheres.  A retrograde and antegrade angiogram was then  performed, which revealed adequate exclusion of the aneurysm and widely  patent graft.  Once this was done, the sheath in the aorta was withdrawn  into the left external iliac artery.  A retrograde left femoral  angiogram was performed to evaluate for closure device.  It was adequate  for closure and the StarClose was successfully deployed.  Next, we set  up for the bypass.  Endo clamp was used to occlude the femoral artery  proximally, and a fistula clamp was used to occlude the artery distally.  A longitudinal arteriotomy was created.  The vein was then brought onto  the table and was prepared in standard fashion.  The vein was  nonreversed.  This vein was spatulated.  An end-to-side anastomosis was  created with a running 5-0 Prolene.  Prior to completion of the  anastomosis, the clamps were released.  One repair stitch was required  for hemostasis.  Next, a valvulotome was used to lyse the valves.  This  was repeated twice.  There was excellent flow through the graft.  The  vein was then brought through the previously created tunnel ensuring  that it maintained its proper orientation without twisting.  Once this  was done, the vein was brought out through the distal medial leg  incision, I ensured that it was in the proper orientation.  Next, a  tourniquet was placed  in the mid thigh.  This was done over a Webril.  The leg was then exsanguinated with an Esmarch.  The tourniquet was  taken to 250 mm of pressure.  The below-knee popliteal artery was then  opened longitudinally.  It was an adequate-size vessel approximately 4  mm.  The vein was then pulled to the appropriate length and transected.  The end was spatulated.  An end-to-side anastomosis was created with a  running 6-0 Prolene.  One repair stitch at the toe of the graft was  required.  Prior to completion of the anastomosis, the tourniquet  released.  The artery was flushed in antegrade and retrograde fashion.  The vein bypass was flushed appropriately.  The anastomosis was then  secured.  I then ligated the popliteal artery proximal and distal  anastomosis.  Doppler was used to evaluate the posterior tibial and  anterior tibial arteries, they had excellent signals, which disappeared  with occlusion of the bypass graft.  At this point, I was very satisfied  with the repair.  Attention was then turned towards the groin, it was  found to be hemostatic.  Femoral sheath was reapproximated with 2-0  Vicryl.  The subcutaneous tissue was closed with another layer of 2-0  Vicryl and 3-0 Vicryl, 4-0 Vicryl was used to close the skin.  A 15  Blake was then placed.  Blake drain was then placed through the vein  harvest site and brought out of the distal leg.  It was sewn into place  with 2-0 nylon.  The below-knee incision was then closed.  The fascia  was reapproximated with 2-0 Vicryl and the subcutaneous tissue was  closed with 3-0 Vicryl and the skin was closed with 4-0 Vicryl.  The  skip incisions were then closed with 3-0 and 4-0 Vicryl.  The wounds  were then closed with Dermabond.  The patient was then successfully  awakened from anesthesia.  The patient  was taken to the recovery room in  a stable condition.  There were no complications.           ______________________________  V. Charlena Cross, MD  Electronically Signed     VWB/MEDQ  D:  03/08/2008  T:  03/09/2008  Job:  [id number]

## 2010-08-19 NOTE — Assessment & Plan Note (Signed)
OFFICE VISIT   KINCAID, TIGER  DOB:  15-Mar-1946                                       04/09/2008  ZOXWR#:[id number]   REASON FOR VISIT:  Followup.   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  On 03/08/2008 patient was taken the operating room, at  that time he underwent endovascular exclusion of his iliac aneurysm and  coil embolization of his internal iliac and persistent sciatic artery.  Simultaneously he underwent right femoral to below-knee popliteal artery  bypass with non-reversed ipsilateral greater saphenous vein.  The  patient's postoperative course was uncomplicated.  He comes back in  today for further evaluation.  He states that he occasionally has  tingling and cramping in his right leg.  Ankle brachial index today  reveals an ABI of 0.56 on the right and 1.0 on the left.  I duplexed his  bypass graft, this reveals some tortuosity at the beginning at the level  of the knee also a size discrepancy likely related to the vein caliber.   The patient also comes in today with a CT scan which shows exclusion of  his aneurysm.   PLAN:  I think that the vein bypass graft needs be better evaluated.  I  am going to proceed with angiogram via access from the left groin.  This  will enable me to better evaluate his bypass graft.  If any percutaneous  intervention is necessary it could be performed at that time.  The  patient also understands that he may require surgical intervention if  his diminished ankle brachial indices were due to problems with the  vein.  His angiogram is scheduled for Tuesday, January 12.   Jorge Ny, MD  Electronically Signed   VWB/MEDQ  D:  04/09/2008  T:  04/10/2008  Job:  [id number]

## 2010-08-19 NOTE — Procedures (Signed)
BYPASS GRAFT EVALUATION   INDICATION:  Follow up right lower extremity bypass graft.   HISTORY:  Diabetes:  No  Cardiac:  Hypertension:  Yes  Smoking:  No  Previous Surgery:  Status post right femoral-popliteal artery bypass  graft and aneurysm repair, March 08, 2008 by Dr. Myra Gianotti   SINGLE LEVEL ARTERIAL EXAM                               RIGHT              LEFT  Brachial:                    123                127  Anterior tibial:             57                 129  Posterior tibial:            71                 137  Peroneal:  Ankle/brachial index:        0.56               1.08   PREVIOUS ABI:  Date: March 09, 2008  RIGHT:  0.62  LEFT:  1.31   LOWER EXTREMITY BYPASS GRAFT DUPLEX EXAM:   DUPLEX:  Femoral-popliteal artery bypass graft (BK) with areas of  elevated velocities in the distal graft with highest at 326 cm/s.  Possible small branch noted.   IMPRESSION:  Stable ABIs bilaterally.  Patent femoral-popliteal artery bypass graft.  Several areas of elevated velocities in distal graft, highest at 326  cm/s.  Postoperative fluid collection in medial thigh distally, possibly due to  greater saphenous vein harvesting.   ___________________________________________  V. Charlena Cross, MD   AS/MEDQ  D:  04/09/2008  T:  04/09/2008  Job:  [id number]

## 2010-08-19 NOTE — Op Note (Signed)
NAME:  Joshua Cain, Joshua Cain               ACCOUNT NO.:  [id number]   MEDICAL RECORD NO.:  [id number]          PATIENT TYPE:  AMB   LOCATION:  SDS                          FACILITY:  [facility]   PHYSICIAN:  Juleen China IV, MDDATE OF BIRTH:  01-10-1946   DATE OF PROCEDURE:  02/07/2008  DATE OF DISCHARGE:                               OPERATIVE REPORT   PREOPERATIVE DIAGNOSIS:  Right hypogastric aneurysm.   POSTOPERATIVE DIAGNOSIS:  Persistent sciatic artery.   PROCEDURES PERFORMED:  1. Abdominal aortogram.  2. Pelvic angiogram.  3. Right lower extremity runoff.  4. Second-order catheterization.  5. Ultrasound-guided access, left femoral artery.  6. Retrograde left femoral angiogram.  7. Closure device (StarClose).   HISTORY:  This is a 65 year old gentleman who initially presented with  GI complaints.  He underwent a CT scan, which showed a right hypogastric  artery aneurysm.  He comes in today for arteriogram and possible  embolization.  Risks and benefits were discussed and informed consent  was signed.   PROCEDURE IN DETAIL:  The patient was identified in the holding area,  taken to room 8, and he was placed supine on the table.  Bilateral  groins were prepped and draped in the standard sterile fashion.  A time-  out was called.  Left femoral artery was evaluated with ultrasound and  found to be patent.  Lidocaine 1% was used for local anesthesia.  The  left femoral artery was accessed under ultrasound guidance.  An 18-gauge  and 0.035 wire was advanced into the aorta under fluoroscopic  visualization and a 6-French Terumo sheath was placed.  Marked pigtail  catheter was advanced to the level of L1 and abdominal aortogram was  obtained.  Next, the catheter was pulled down to the pelvis and pelvic  angiogram was obtained.  Using an Omni flush catheter and Bentson wire,  the aortic bifurcation was crossed.  The catheter was placed into the  right external iliac artery and right leg  runoff was obtained.  Also,  the right hypogastric artery was selected.  Branches of the hypogastric  artery were selected and a right leg runoff was obtained.   FINDINGS:  Aortogram:  Visualized portion of suprarenal abdominal aorta  showed minimal disease.  There were single renal arteries bilaterally.  There was minimal disease within the infrarenal abdominal aorta.  Left  common, external, and internal iliac arteries were widely patent.  The  right common and external iliac arteries were patent.  There was  aneurysmal dilatation of the right hypogastric artery.   Right lower extremity:  Catheter injection from the right external iliac  artery revealed opacification of the profunda femoral artery.  With the  catheter into the branch of the hypogastric artery, opacification of the  superficial femoral artery and tibial vessels was performed indicating  that this was a persistent sciatic artery with aneurysmal dilatation at  its proximal extent.  The patient did have 3-vessel runoff down to the  ankle.   With these new findings, decision was made not to proceed.  Retrograde  left femoral angiogram was obtained to evaluate  for closure device and  found to have adequate access.  StarClose was deployed.  No  complications.   IMPRESSION:  Persistent right sciatic artery with aneurysmal dilatation.           ______________________________  V. Charlena Cross, MD  Electronically Signed     VWB/MEDQ  D:  02/07/2008  T:  02/07/2008  Job:  [id number]

## 2010-08-19 NOTE — Discharge Summary (Signed)
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   FINAL DISCHARGE DIAGNOSES:  1. Persistent right sciatic artery with aneurysmal degeneration of      right hypogastric and right common iliac artery, status post right      femoral-popliteal artery bypass and endovascular repair of right      hypogastric and right common iliac artery aneurysm.  2. Hypertension.  3. Left inguinal hernia.  4. History of perirectal abscess.   PROCEDURES:  1. March 08, 2008, right femoral to below-knee popliteal artery      bypass with non-reversed ipsilateral translocated saphenous vein      graft, ultrasound-guided access of left common femoral artery,      abdominal aortogram, and pelvic angiogram.  2. Aortic catheterization x2 with followup angiogram x2.  3. Embolization of the right hypogastric artery secondary branches,      endovascular repair of right hypogastric and right common iliac      artery aneurysm, retrograde left femoral angiogram with Star      closure on the left.  4. March 09, 2008, postoperative ankle-brachial indices showing ABI      of 0.62 on the right and 1.30 on the left.   SURGEON:  1. Charlena Cross, MD   BRIEF HISTORY:  Mr. Cullars is a 65 year old black male who Dr. Myra Gianotti  has been following for persistent right sciatic artery.  He also has  aneurysmal degeneration of his right hypogastric artery as well as right  common iliac artery.  Dr. Myra Gianotti discussed treatment options and  recommended that he undergo repair  including embolization of his right  sciatic artery, endovascular repair of his iliac aneurysms, and right  femoral-popliteal artery bypass grafting.   HOSPITAL COURSE:  Mr. Niazi was electively admitted to [facility] on March 08, 2008, he underwent a previously mentioned  procedure.  Postoperatively, he was transferred to Step-Down Unit 3300  where he remained to the first 24 hours.  At the time of dictation, he  has had a relatively uneventful postoperative course.  He felt  appropriate for transfer to telemetry floor on postop day #1 and was  remained there until discharge.  Rolling walker was arranged for home,  and he has been able to ambulate independently with a walker.  He  reported no difficulty voiding and has been tolerating food and reports  pain is controlled.  On March 11, 2008, he was felt appropriate for  discharge.  His vitals showed blood pressure 129/82, temperature 98.9,  heart rate 99, and oxygen saturation 97% on room air.  His incisions are  healing well without signs of infection.  He had a Jackson-Pratt drain  placed intraoperatively, which has been discontinued.  There is no  evidence of hematoma.  He has Doppler signals of bilateral posterior  tibial and dorsalis pedis pulses.   DISPOSITION:  Plan to discharge home on postop day #3, March 11, 2008,  the patient in stable and improving condition.   DISCHARGE MEDICATIONS:  1. Hydrochlorothiazide/lisinopril 12.5/20 mg 1 tablet p.o. daily      (midday).  2. Promethazine with codeine cough syrup 5 mL p.o. q.6 h. p.r.n.      cough.  3. Percocet 5/325 mg 1 tablet p.o. q.4 h. p.r.n. pain.  4. Keflex 500 mg p.o. q.8 h.  He was on this preoperatively and should      continue this until his prescribed course is complete.  5. Coated aspirin 325 mg p.o. daily.   DISCHARGE INSTRUCTIONS:  He should follow heart-healthy diet.  Continue  daily walking exercises.  Avoid driving or heavy lifting for the next  2-  3 weeks.  He may shower and clean incisions gently with soap and water.  Call if he develops fever, sign of infection, or increased pain.  He  will see Dr. Myra Gianotti in approximately 2-3 weeks with ABIs and a CT of  the abdomen and pelvis.  He should call sooner if needed.      Jerold Coombe, P.A.      Jorge Ny, MD  Electronically Signed    AWZ/MEDQ  D:  03/11/2008  T:  03/11/2008  Job:  [id number]   cc:   Jordan Hawks. Elnoria Howard, MD  Osvaldo Shipper. Spruill, M.D.

## 2010-08-19 NOTE — Op Note (Signed)
Joshua Cain, Joshua Cain               ACCOUNT NO.:  [id number]   MEDICAL RECORD NO.:  [id number]          PATIENT TYPE:  INP   LOCATION:  2313                         FACILITY:  [facility]   PHYSICIAN:  Juleen China IV, MDDATE OF BIRTH:  1945/05/02   DATE OF PROCEDURE:  05/17/2008  DATE OF DISCHARGE:                               OPERATIVE REPORT   PREOPERATIVE DIAGNOSIS:  Right leg claudication.   POSTOPERATIVE DIAGNOSIS:  Right leg claudication.   PROCEDURES PERFORMED:  1. Redo right femoral-to-below-knee popliteal artery bypass graft with      spliced right arm vein.  2. Thrombectomy of right posterior tibial and anterior tibial artery.  3. Intraoperative angiogram.   TYPE OF ANESTHESIA:  General.   BLOOD LOSS:  200 mL.   COMPLICATIONS:  None.   SPECIMENS:  None.   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  He underwent endovascular exclusion of his  persistent sciatic artery and subsequent right femoral-popliteal artery  bypass graft with right leg vein.  The patient's bypass graft failed  early.  He developed claudication when his bypass graft failed.  It was  felt that his bypass graft likely failed secondary to the caliber of the  vein.  For that reason, he comes back in for redo bypass graft with arm  vein.   PROCEDURE:  The patient was identified in the holding and taken to room  9.  He was placed supine on the table.  General endotracheal anesthesia  was administered.  The patient was prepped and draped in standard  sterile fashion.  A time-out was called and antibiotics were given.  The  patient's previous right groin incision was opened with #10 blade.  Cautery was used to dissect through the subcutaneous tissue.  The  patient had dense scar tissue in this region.  The scar tissue was  divided using a combination of sharp dissection with a 15 blade and  Metzenbaum scissors as well as Bovie cautery.  I  was able to identify  the anastomosis of the previous vein graft, mobilize the common femoral  artery proximal to this, and placed a vessel loop around it in this  area.  The common femoral artery was then mobilized below the bypass  graft to its first major branch.  Each branch was individually isolated  and encircled with a vessel loop.  Next, the patient's below-knee  incision was reopened.  Cautery was used to divide the subcutaneous  tissue.  The fascia was opened with Bovie cautery.  Self-retaining  retractors were used to aid with exposure.  The femoral vein was first  encountered and this was fully mobilized so that it could be retracted  away from the artery.  The below-knee popliteal artery was identified in  order to better visualize the popliteal artery and divided the posterior  tibial vein.  The below-knee popliteal artery was identified as was the  previous bypass graft.  The popliteal artery was dissected down to the  branch of the posterior tibial and anterior tibial artery which  were  each individually isolated.  Once the artery was satisfactorily exposed,  a long tunneler was used to create a subsartorial tunnel.  Next,  attention was turned towards the harvest of the arm vein.  Ultrasound  was used to identify the course of the cephalic vein in the arm.  Preoperative imaging had found it to be an adequate conduit.  The right  arm cephalic vein was harvested through skip incisions.  It was divided  between 3-0 silk and metal clips.  The vein was mobilized up to the  shoulder.  At about the level of the deltoid, the caliber of the vein  decreased.  I still felt that it was at least 3 mm and adequate for  conduit.  Once I had mobilized as much of the vein as I could, the ends  were ligated with 2-0 silk ties and the vein was removed.  The vein was  then placed on the back table.  A vein cannula was inserted and the vein  was distended.  It distended to 3-4 mm in diameter.   I did have to  repair a defect in the vein with a vein patch.  Once this was completed,  I felt this was an adequate conduit.  The vein, however, was very thin-  walled.  At this point in time, the patient was given systemic  heparinization.  The proximal anastomosis was performed first.  The vein  was placed in a reversed orientation.  The common femoral artery was  opened with a #11 blade and then taken down.  It was opened below the  previous bypass down to the first main branch.  The vein was then  spatulated and end-to-side anastomosis was performed with 6-0 Prolene.  Prior to completion of the anastomosis, the artery was flushed  appropriately and the anastomosis was then secured.  There was good  pulsatile flow through the vein.  Next, the vein was marked to ensure  appropriate orientation and brought through the previously created  tunnel.  The leg was then exsanguinated with an Esmarch and a tourniquet  was placed up on the leg.  The below-knee popliteal artery was opened in  a longitudinal fashion.  Thrombus was visualized within the below-knee  popliteal artery.  This was removed with DeBakey forceps.  Fogarty  embolectomy balloon was passed down the anterior tibial and posterior  tibial artery and no further thrombus was evacuated.  The vein was then  cut to the appropriate length and then the end was spatulated.  An end-  to-side anastomosis was performed with a running 6-0 Prolene.  Prior to  completion of the anastomosis, the artery was flushed and the artery was  allowed to backbleed and the vein bypass was flushed as the tourniquet  was taken down.  Once the anastomosis was secured, I evaluated Doppler  signal in the foot and felt that this was marginal and therefore I  elected to shoot an arteriogram.  Intraoperative arteriogram was  performed.  There appeared to be a small filling defect within the  distal vein as well as within the proximal posterior tibial artery.  I   felt that the defect in the distal vein was likely secondary to the poor  caliber of vein at this level and there was also residual thrombus  within the posterior tibial artery.  For that reason, I elected to take  down the anastomosis.  I was able to evacuate a small piece of thrombus  in the posterior tibial artery and this reestablished excellent  backbleeding.  At this point, I felt I needed to harvest more arm vein  in order to provide a good distal aspect of the vein conduit.  I had  already mobilized a branch of the vein which I felt could serve as a  good distal conduit.  This branch was then harvested and prepared on the  back table.  Once it was fully prepared, it then was brought down to the  below-knee incision.  The vein bypass was occluded and the portion of  the vein which I though was suboptimal was transected.  Both of the 2  ends of the vein were spatulated and an end-to-end anastomosis was  created.  The vein was allowed to bleed and there was pulsatile flow.  The vein was then occluded.  I took down the previous anastomosis and  transected the artery.  At this time, I performed an end-to-end  anastomosis between the vein and the below-knee popliteal artery.  The  hood of the bypass graft went down onto the posterior tibial artery.  This anastomosis was created with a 6-0 Prolene.  After being  appropriately flushed, clamps were released.  There was an excellent  Doppler signal within the vein proximally; however, below where the 2  ends were spliced together, there was a band from the signal.  I,  therefore, explored the vein-to-vein anastomosis.  It appeared that this  was adequate and there were no flow-limiting lesions, and the spliced  veins were then sewn back together with 6-0 Prolene.  Clamps were  released.  There was good signal within the vein at this point in time  as well as in the popliteal artery below the bypass graft.  I still was  not very happy with  the signal in the posterior tibial and anterior  tibial artery; however, the patient does have atherosclerotic disease in  these arteries.  The diminished signals were likely due to distal  disease.  At this point, I do not see anything further that could be  done and we set up to close.  I did not reverse the heparin.  The wounds  were then copiously irrigated.  The below-knee incision was closed with  2-0 Vicryl to reapproximate the fascia and then a 4-0 Vicryl on the  skin.  In the groin after irrigation, the tissue was closed in multiple  layers with 2-0 Vicryl and the skin was closed with 4-0 Vicryl.  The  vein harvest sites were closed with a layer of 3-0 Vicryl and 4-0  Vicryl.  Dermabond was placed on all of the wounds.  The arm was placed  in an Ace wrap.  Due to the  length of the procedure, we elected to keep the patient intubated  overnight.  He was taken to the surgical intensive care unit in stable  condition.  Should this bypass go down, the patient would need fem below-  knee popliteal bypass with Gore-Tex.            ______________________________  V. Charlena Cross, MD  Electronically Signed     VWB/MEDQ  D:  05/18/2008  T:  05/18/2008  Job:  [id number]

## 2010-08-19 NOTE — Assessment & Plan Note (Signed)
OFFICE VISIT   MARDY, LUCIER  DOB:  1945-05-23                                       01/02/2008  ZOXWR#:[id number]   REASON FOR VISIT:  Followup right iliac aneurysm.   HISTORY:  This is a 64 year old gentleman whom I initially saw at the  request of Dr. Elnoria Howard for right hypogastric common iliac aneurysm.  He has  been undergoing treatment for perirectal abscess which has recently been  drained.  He is doing well from that perspective now.  He is about 1  week post treatment.  He is complaining of some dizziness and arm pain  with arm elevation.  He does not have any abdominal pain.   PHYSICAL EXAMINATION:  Blood pressure 106/69, pulse is 101.  He is well  appearing, in no distress.  His legs are warm and well perfused.  Abdomen:  Soft, nontender.  He has palpable radial pulses.   DIAGNOSTIC STUDIES:  Had carotid ultrasounds performed, which reveal  brachial pressures are symmetric bilaterally of 112 and no evidence of  internal carotid stenosis.   ASSESSMENT/PLAN:  Right internal and common iliac aneurysm.   PLAN:  I had a long conversation with the patient regarding management  of this process.  We have elected to proceed with endovascular repair.  This will first began with coiling of the branches of his right  hypogastric artery.  Given that he is recently recovering from his  perirectal abscess drainage, I am going to delay this approximately 1  month.  We will plan access on the left side coming up and over and  accessing the hypogastric artery with coil embolization of the outflow  branches of the aneurysm.  Also place coils win aneurysm at that time.  Once this is completed as a second stage at a later date, I would deploy  covered stent placement across the origin hypogastric artery.  This may  require bifurcated device.  However, I will be unable to make that  decision until we have better images at the time of arteriography.  This  may even  require flipping a limb of the graft to accommodate the  difference in diameter of the common and external iliac artery.  This  procedure has been scheduled for Thursday, October 29.   Jorge Ny, MD  Electronically Signed   VWB/MEDQ  D:  01/02/2008  T:  01/03/2008  Job:  [id number]   cc:   Jordan Hawks. Elnoria Howard, MD  Osvaldo Shipper. Spruill, M.D.

## 2010-08-19 NOTE — Procedures (Signed)
CAROTID DUPLEX EXAM   INDICATION:  Dizziness.   HISTORY:  Diabetes:  No.  Cardiac:  No.  Hypertension:  Yes.  Smoking:  No.  Previous Surgery:  CV History:  No.  Amaurosis Fugax No, Paresthesias No, Hemiparesis No.                                       RIGHT             LEFT  Brachial systolic pressure:         112               110  Brachial Doppler waveforms:         Normal            Normal  Vertebral direction of flow:        Antegrade         Antegrade  DUPLEX VELOCITIES (cm/sec)  CCA peak systolic                   116               85  ECA peak systolic                   65                82  ICA peak systolic                   60                49  ICA end diastolic                   23                17  PLAQUE MORPHOLOGY:                  None              None  PLAQUE AMOUNT:                      None              None  PLAQUE LOCATION:                    None              None   IMPRESSION:  No evidence of stenosis noted in the bilateral internal  carotid arteries.   ___________________________________________  V. Charlena Cross, MD   CH/MEDQ  D:  01/02/2008  T:  01/02/2008  Job:  [id number]

## 2010-08-19 NOTE — Assessment & Plan Note (Signed)
OFFICE VISIT   NEWT, LEVINGSTON  DOB:  03/18/1946                                       06/11/2008  ZOXWR#:[id number]   REASON FOR VISIT:  Follow-up.   HISTORY:  This is a 65 year old gentleman initially found to have a  persistent sciatic artery.  On December 3rd he underwent endovascular  exclusion of his aneurysmal internal iliac artery and coil embolization  of its branches.  Subsequent to that, he had a right femoral to below  knee popliteal artery bypass with greater saphenous vein.  Unfortunately, this occluded and came back and underwent redo femoral  below-knee bypass with arm vein on February 11th.  This also occluded  early and this was converted to a femoral below-knee bypass with a 6-mm  PTFE on February 14th.  He has recovered well and has been discharged  home.  At home he is walking for approximately 45 minutes.  He still has  problems with leg swelling.  On examination, his incisions are all well-  healed, there is no evidence of infection.   Diagnostic studies were performed today.  His ankle brachial index is  1.0 on the right.   ASSESSMENT/PLAN:  Status post redo right femoral below knee popliteal  artery bypass graft with Gore-Tex.  The patient will be placed on our  ultrasound surveillance protocol.  He will maintain himself on an  aspirin.  I have told him to stay out of work until May 1st, due to the  swelling he is having in his legs.  I am going to see him back in 6  months.   Jorge Ny, MD  Electronically Signed   VWB/MEDQ  D:  06/11/2008  T:  06/12/2008  Job:  [id number]

## 2010-08-19 NOTE — Assessment & Plan Note (Signed)
OFFICE VISIT   TANIS, BURNLEY  DOB:  [birth year]                                       07/30/2008  ZOXWR#:[id number]   REASON FOR VISIT:  Followup.   HISTORY:  This is a 65 year old who was found to have a persistent  sciatic artery.  He has undergone repair.  He had a complicated course  but is doing very well at this time.  He comes back in to evaluate him  to go back to work.  He is still having some swelling in his right leg.  His incisions were all well healed.  He has a palpable dorsalis pedis  pulse.   I am clearing Mr. Havey to go back to work.  He will be doing a lot of  standing and, therefore, I am recommending that he wear compression  stockings grade 20 to 30.  He is going to go get these fitted.  I am  going to see him back at his regularly scheduled appointment which will  be in about 4 to 5 months.  He is on bypass protocol.   Jorge Ny, MD  Electronically Signed   VWB/MEDQ  D:  07/30/2008  T:  07/31/2008  Job:  [id number]   cc:   Dr. Marny Lowenstein

## 2010-08-19 NOTE — Discharge Summary (Signed)
Joshua Cain, Joshua Cain               ACCOUNT NO.:  [id number]   MEDICAL RECORD NO.:  [id number]          PATIENT TYPE:  INP   LOCATION:  2013                         FACILITY:  [facility]   PHYSICIAN:  Juleen China IV, MDDATE OF BIRTH:  [birth year]   DATE OF ADMISSION:  05/17/2008  DATE OF DISCHARGE:  05/23/2008                               DISCHARGE SUMMARY   ADMISSION DIAGNOSIS:  Peripheral vascular disease with right leg  claudication.   FINAL/DISCHARGE DIAGNOSES:  1. Peripheral vascular disease with right leg claudication status post      right femoropopliteal artery bypass.  2. History of persistent sciatic artery and aneurysmal degeneration of      his internal iliac artery status post endovascular exclusion of his      iliac aneurysm and coil embolization of his internal iliac and      persistent sciatic artery on the right on March 08, 2008, in      conjunction  with right femoropopliteal artery bypass grafting with      saphenous vein.  3. Hypertension.  4. History of left inguinal hernia.  5. History of perirectal abscess.  6. Mild postoperative tachycardia.  7. Hypokalemia status post supplementation.  8. Postoperative acute blood loss anemia.   PROCEDURES:  1. May 17, 2008, redo right femoral to below-knee popliteal      artery bypass using right arm vein, thrombectomy of right posterior      tibial and anterior tibial arteries, intraoperative arteriogram by      Dr. Coral Else.  2. May 20, 2008, redo right femoral to below-knee popliteal      artery bypass using 6-mm Propaten graft and thrombectomy of right      anterior tibial and posterior tibial arteries with intraoperative      arteriogram by Dr. Venida Jarvis.  3. May 22, 2008, postoperative ankle-brachial index showing 0.96      on the right and greater than 1 on the left.   CONSULT:  Physical therapy.   BRIEF HISTORY:  Joshua Cain is a 65 year old male who initially presented  with  sciatic artery and aneurysmal degeneration of his right internal  iliac artery ended up with endovascular exclusion of his aneurysm and  coil embolization of his hypogastric and persistent sciatic artery and  covered stenting of his iliac artery system.  Following the procedure,  he underwent right femoral to below-knee popliteal artery bypass using  ipsilateral nonreversed greater saphenous vein.  He had an uncomplicated  postoperative course and was discharged home from the hospital.  On  follow up, his ankle-brachial indices on the right were 0.6 compared to  1.0 on the left.  Duplex was concerning for tortuosity or stenosis of  the distal bypass graft.  On January 12, he underwent an arteriogram  with finding showing successful exclusion of hypogastric aneurysm.  However, the patient's bypass graft was not visible and presumably  occluded.  He was seen back in followup and reported symptoms of  persistent claudication as well as feeling of pins and needles in his  foot at night.  Symptoms were  alleviated by resting his foot over the  bed.  Dr. Myra Gianotti recommended redo femoral bypass grafting.  Since leg  vein was already utilized, he opted to proceed with right cephalic vein  harvesting.   HOSPITAL COURSE:  Joshua Cain was electively admitted to [facility] on May 17, 2008.  He underwent a redo right  femoropopliteal artery bypass using right cephalic vein.  Postoperatively, he was extubated, neurologically intact; however, upon  morning evaluation of his pedal Doppler signals were rather weak.  He  had ABI showing decreased on the right to 0.5, the left remained greater  than 1.  Ultimately, a decision was made to redo the bypass with  Propaten graft which was done on February 14.  Postoperatively, ABIs on  the right improved to 0.96.  He had a strong dorsalis pedis pulse.  His  incisions were healing well without signs of infection.  He was able to  void following the  Foley catheter removal and ambulate with a rolling  walker.  Physical Therapy did feel that he would need home health  physical therapy, which was arranged.  He was ultimately felt  appropriate for discharge home on February 17.  During this  hospitalization, he was monitored for tachycardia primarily associated  with activity.  Heart rate was up to 140, sinus tach and in the low 100s  with rest.  He was subsequently started on low-dose beta-blocker therapy  and showed improvement.  Vitals on discharge showed his blood pressure  115/61, heart rate 95, O2 saturation 95% on room air, and temperature  was 98.  However, he did have a temperature spike at 101 within 24 hours  of discharge.  This was felt most likely secondary to atelectasis as he  had no obvious signs of infection and white count was 4.7.  He had  fluids advanced throughout his hospitalization as well as Lovenox for  DVT prophylaxis.  His other labs showed the potassium of 3.0 which was  supplemented, glucose of 121, BUN of 6, creatinine 0.71, hemoglobin of  8.1, hematocrit 24, and platelet count 204.  His hemoglobin and  hematocrit felt decreased due to acute blood loss anemia.  The patient  remained asymptomatic and we felt the patient  improve with time.   DISPOSITION:  Joshua Cain was discharged home on May 23, 2008.   DISCHARGE MEDICATIONS:  1. Hydrochlorothiazide 25 mg daily.  2. Aspirin 325 mg daily.  3. Toprol-XL 25 mg daily.  4. Percocet 5/325 mg 1-2 tablets p.o. q.4 h. p.r.n. pain.   DISCHARGE INSTRUCTIONS:  Continue heart-healthy diet.  Increase his  activity slowly.  May shower and clean incisions gently with soap and  water.  Avoid driving or heavy lifting for the next few weeks.  See Dr.  Myra Gianotti in approximately 3 weeks for postoperative ABIs.  Our office  will contact him regarding specific appointment date and time.  In the  meantime, he is to call if he has persistent fever greater than 101,  redness  or drainage from his incision, or any increased pain.  He was  also instructed to make an appointment with his primary physician within  the next 2 weeks to follow up on his blood pressure and heart rate, and  we prescribed Toprol.      Jerold Coombe, P.A.      Jorge Ny, MD  Electronically Signed    AWZ/MEDQ  D:  05/23/2008  T:  05/24/2008  Job:  [id number]   cc:   Osvaldo Shipper. Spruill, M.D.  Jordan Hawks Elnoria Howard, MD  V. Charlena Cross, MD

## 2010-08-19 NOTE — Procedures (Signed)
BYPASS GRAFT EVALUATION   INDICATION:  Follow-up right lower extremity bypass graft.   HISTORY:  Diabetes:  No.  Cardiac:  No.  Hypertension:  Yes.  Smoking:  No.  Previous Surgery:  Right re-do femoral-popliteal artery bypass graft  05/18/2008 by Dr. Myra Gianotti.   SINGLE LEVEL ARTERIAL EXAM                               RIGHT              LEFT  Brachial:                    145                146  Anterior tibial:             122                155  Posterior tibial:            140                163  Peroneal:  Ankle/brachial index:        0.96               1.12   PREVIOUS ABI:  Date: 08/20/2008  RIGHT:  1.0  LEFT:  1.12   LOWER EXTREMITY BYPASS GRAFT DUPLEX EXAM:   DUPLEX:  1. Doppler arterial waveforms appear biphasic proximal to, within and      distal to bypass graft.  2. Large arterial vessel extending throughout right thigh noted,      suggestive of collateral vessel.   IMPRESSION:  1. Patent right femoral-popliteal artery bypass graft.  2. Bilateral ankle brachial indices appear stable.     ___________________________________________  V. Charlena Cross, MD   AS/MEDQ  D:  11/26/2008  T:  11/26/2008  Job:  [id number]

## 2010-08-19 NOTE — Assessment & Plan Note (Signed)
OFFICE VISIT   HANEEF, HALLQUIST  DOB:  11/16/1945                                       12/05/2007  [id number]#:[id number]   REASON FOR VISIT:  Right iliac aneurysm.   This is a 65 year old gentleman seen at the request Dr. 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  These were diagnosed by CT scan.  The patient has been seeing  Dr. Elnoria Howard for evaluation of GI bleeding.  He has undergone multiple  testing and, to my knowledge with the records I have, no etiology has  been determined.  Also the patient most recently was treated for  perirectal abscess versus a fissure with sitz baths and antibiotics.  I  am seeing him for evaluation of aneurysm.  He has been asymptomatic.  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  His  aorta is essentially normal as is the left iliac arterial system.  The  patient does not have a cardiac history.  He does take medicine for  hypertension.   REVIEW OF SYSTEMS:  GENERAL:  Positive for weight loss and fevers.  CARDIAC:  Negative chest pain.  PULMONARY:  Negative for shortness of breath, asthma, or wheezing.  GASTROINTESTINAL:  Positive for blood in the stool as well as diarrhea.  GU:  Negative.  VASCULAR:  The above aneurysms.  NEURO:  Negative.  ORTHO:  Positive for arthritis.  PSYCH:  Negative.  ENT:  Negative.  HEME:  Positive for anemia.   PAST MEDICAL HISTORY:  Hypertension.   PAST SURGICAL HISTORY:  Left inguinal hernia.   FAMILY HISTORY:  Negative for cardiovascular disease.   SOCIAL HISTORY:  He is married with two children.  Does not smoke.  Has  never smoked.  Does not drink alcohol.   MEDICATIONS:  Include hydrochlorothiazide/lisinopril 20 mg per day,  metronidazole 250 mg t.i.d. for 14 days, ciprofloxacin 500 mg every 12  hours for 14 days, alprazolam 0.25 mg p.r.n. anxiety.   ALLERGIES:  None.   PHYSICAL EXAMINATION:  Blood pressure 117/78, pulse is 110.  General:  Well appearing in no acute distress.  HEENT:  Normocephalic, atraumatic.  Pupils are equal.  Sclerae are anicteric.  Neck is supple.  There is no  JVD.  There are no carotid bruits.  Cardiovascular:  Regular rate and  rhythm.  No murmurs, rubs or gallops.  Pulmonary:  Clear bilaterally.  Abdomen:  Soft, nontender.  No hepatosplenomegaly.  No pulsatile mass.  Extremities:  Femoral pulses and popliteal pulses are palpable.  Feet  are warm and well perfused without ulceration.  Neuro:  He is grossly  intact.  Psych:  He is alert and oriented x3.  Skin:  Without rash.   DIAGNOSTIC STUDIES:  I have reviewed the patient's CT scan, and the  above the right hypogastric and right common iliac aneurysm..   ASSESSMENT/PLAN:  Right common and right hypogastric aneurysm.   PLAN:  I have discussed potential options for treating his aneurysm.  Given the size of his hypogastric aneurysm being 3.7 cm I have recommend  proceeding with repair.  I offered 2 options.  1. Open repair.  2. Percutaneous/endovascular repair.   We focused primarily on endovascular repair.  I do think, given  the size  and tapering of his iliac arterial system from his common down into his  external iliac, that this will be somewhat challenging, and may even  require a bi-iliac device.  I potentially could flip around an iliac  extension And treat this from a single side approach.  Regardless, the  patient will need an arteriogram with embolization of his hypogastric  branches.  He appears to have 2 large branches.  Given that he has  ongoing GI bleeding, I am a little reluctant to proceed with a vascular  intervention at this time given the need for heparinization.  I do not  think his GI bleeding is related to any mesenteric fistula.  I am going  to see the patient back in 6 weeks and hopefully, he will have a better  idea as to the  etiology of his GI bleeding and we can get this  controlled, so that at time of possible heparinization for his  procedure, that he is not at risk for severe bleeding.  Also, I would  not want to proceed at this time given that he had recent infection from  a perirectal abscess.  Again I will see him back in 6 weeks.   Jorge Ny, MD  Electronically Signed   VWB/MEDQ  D:  12/05/2007  T:  12/06/2007  Job:  [id number]   cc:   Jordan Hawks. Elnoria Howard, MD  Osvaldo Shipper. Spruill, M.D.

## 2010-08-19 NOTE — Assessment & Plan Note (Signed)
OFFICE VISIT   Joshua Cain, Joshua Cain  DOB:  1945/05/21                                       02/25/2009  EAVWU#:[id number]   REASON FOR VISIT:  Follow-up.   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  He has had a complicated course, however, ultimately he  underwent a right femoral to below-knee bypass graft with Gore-Tex.  This followed an occluded vein bypass graft after coil embolization of  his persistent sciatic artery which had become aneurysmal.  Patient  stated about a month to 2 months ago he started having increasing cramps  when he would walk.  This happens at approximately 200 feet.  Cramps are  improved with resting  and they are brought about by walking certain  distances.  He is not having any rest pain.  He is not having any  ulcers.  This is not lifestyle limiting for him.  Patient is also  complaining of difficulty with stretching as well as difficulty with  getting an erection.   The patient continues to suffer from hypertension which is medically  managed.  He continues to be a nonsmoker.   REVIEW OF SYSTEMS:  Negative for chest pain and shortness of breath.  All others negative as documented on the encounter form.   PHYSICAL EXAMINATION:  Blood pressures 146/80, heart rate 69,  temperature is 98.4.  General:  Well-appearing in no acute distress.  Head:  Normocephalic, atraumatic.  ENT:  His pupils are equal.  Sclerae  anicteric.  Lungs:  Clear bilaterally.  Cardiovascular:  Regular rate  and rhythm.  Abdomen:  Soft.  No pulsatile mass.  Musculoskeletal:  There are no major deformities.  Neurology:  Without focal deficits.  Skin:  Without rash.  Right leg is warm and well-perfused.  Incisions  are well-healed.  There is no ulceration.   DIAGNOSTIC STUDIES:  I have independently reviewed his ultrasound that  shows an occluded bypass graft.  His ankle brachial indexes dropped to  0.6 down from 0.96  in August.   ASSESSMENT:  Occluded bypass graft, right femoral -popliteal.   PLAN:  I have discussed with the patient that this is not a lifestyle  limiting symptom that he is having and therefore I recommended  conservative management.  We talked about a formal exercise program.  I  have also given him a  prescription for cilostazol which he will take if  the need arises.   I told him that his erectile dysfunction potentially is related to  coiling of the hypogastric aneurysm.  I did recommend attempt at  medication therapy to help with this.  If this is not successful, we  would consider sending him to a urologist.  He is going to come back to  see me in 6 months.   Jorge Ny, MD  Electronically Signed   VWB/MEDQ  D:  02/25/2009  T:  02/26/2009  Job:  [id number]

## 2010-08-19 NOTE — Op Note (Signed)
NAMEBRANDONN, Joshua Cain               ACCOUNT NO.:  [id number]   MEDICAL RECORD NO.:  [id number]          PATIENT TYPE:  INP   LOCATION:  2013                         FACILITY:  [facility]   PHYSICIAN:  Juleen China IV, MDDATE OF BIRTH:  November 02, 1945   DATE OF PROCEDURE:  05/20/2008  DATE OF DISCHARGE:                               OPERATIVE REPORT   PREOPERATIVE DIAGNOSIS:  Right leg claudication.   POSTOPERATIVE DIAGNOSIS:  Right leg claudication.   PROCEDURE PERFORMED:  1. Right femoral to below knee popliteal artery bypass with 6-mm      Propatent polytetrafluoroethylene.  2. Thrombectomy of right anterior tibial and posterior tibial      arteries.  3. Intraoperative arteriogram.   SURGEON:  1. Charlena Cross, MD   ASSISTANT:  Wilmon Arms, PA   ANESTHESIA:  General.   BLOOD LOSS:  150 mL.   COMPLICATIONS:  None.   FINDINGS:  Good signals in the dorsalis pedis and posterior tibial  artery.   INDICATIONS:  Mr. 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  He underwent  right femoral below knee popliteal artery bypass graft with nonreversed  ipsilateral greater saphenous vein. This occluded early and I thought it  was likely secondary to the conduit.  In an attempt to use an autologous  graft, he was taken back for femoral below knee popliteal bypass with  arm vein.  I was concerned that his arm vein was not adequate; however,  after having harvested the entire right saphenous vein, I elected to use  it.  His postoperative duplex was concerning that this would not be a  good long-term result.  Therefore, after long conversation with he and  his wife, we had elected to go back for placement of a Gore-Tex bypass.   PROCEDURE IN DETAIL:  The patient was identified in the holding area and  taken to room #6, where he was placed supine on the table.  General  endotracheal anesthesia was administered.   The patient was prepped and  draped in a standard sterile fashion, a time-out was called, and  antibiotics were given.  The patient's previous incision in the groin  and below knee were opened.  The previous sutures were removed.  Easy  access was obtained down to the common femoral artery.  A vessel loop  was placed proximal to his bypass graft.  There were 2 main branches of  the femoral artery, both of which were encircled with vessel loop.  Similarly, I identified the vein bypass anastomosis to the below knee  popliteal artery.  Once this was done, a long straight tunneler was used  to create a subsartorial tunnel.  Once the tunnel was created, the  patient was given systemic heparinization.  After the heparin  circulated, femoral artery was occluded with vascular clamps.  A #11  blade was used to take down the previous anastomosis.  The artery was  inspected and found to be in good condition.  A 6-mm propatent Gore-Tex  graft was brought onto  the field.  The end was spatulated to fit the  size of the arteriotomy.  A running anastomosis was created with a CV-6  Gore suture.  Prior to completion of the anastomosis, the artery was  flushed antegrade and retrograde fashion.  The anastomosis was then  secured.  One repair stitch was required for hemostasis.  There was good  flow through the graft.  Next, graft was flushed with heparin saline and  reoccluded.  Graft was then brought through the tunnel making sure that  the appropriate alignment was maintained.  Next, a Webril was placed in  the upper thigh, the leg was exsanguinated with an Esmarch, and a  tourniquet was placed up to 250 mmHg pressure.  Next, the previous  bypass was taken down.  There was thrombus identified in the popliteal  artery.  This was removed.  I passed a #3 Fogarty embolectomy catheter  down the anterior tibial artery and did evacuate some thrombus.  This  established back bleeding.  I also had to open up onto  the posterior  tibial artery in order to evac tibial artery.  There was thrombus also  in the vein bypass graft.  Once I was confident, I removed all the  thrombus in the anterior tibial and posterior tibial  artery.  The  anastomosis was created with a running Gore CV-6 suture.  Prior to  completion of anastomosis, the tourniquet was taken down.  The tibial  arteries were allowed to back bleed.  There was good back bleeding.  The  graft was then flushed.  The anastomosis was then secured.  Handheld  Doppler was used to evaluate signals in the anterior tibial and  posterior tibial artery, and I was pleased with these signals.  I then  placed a butterfly needle in the proximal graft, which was occluded and  intraoperative arteriogram was performed.  Excellent flow was visualized  in the anterior tibial and posterior tibial artery.  There was luminal  narrowing in the proximal posterior tibial artery that I was not sure  whether this was due to disease within the artery or spasm.  I attempted  to explore the posterior tibial artery; however, we did encounter some  bleeding which was easily controlled.  However, I felt that proceeding  that was probably not in the best interest of the patient since we had  good angiographic results at this point.  Next, the patient's heparin  was reversed with 50 mg of protamine.  A mattress suture was used to  close the angiogram site and the Gore-Tex graft.  Once hemostasis was  achieved, the groin and the below-knee incision were copiously irrigated  using antibiotic-impregnated saline.  Once hemostasis was achieved, the  groin was closed in multiple layers of 2-0 Vicryl and the skin was  reapproximated with a 3-0 and 4-0 Vicryl.  In the below-knee incision,  once hemostasis was achieved 2-0 Vicryl was used to close the deep  tissue and skin was closed with 4-0 Vicryl.  Dermabond was placed.  The  patient was successfully extubated and taken to the  recovery room in  stable condition.  There were no complications.           ______________________________  V. Charlena Cross, MD  Electronically Signed     VWB/MEDQ  D:  05/20/2008  T:  05/21/2008  Job:  [id number]

## 2010-08-19 NOTE — Procedures (Signed)
BYPASS GRAFT EVALUATION   INDICATION:  Follow up right lower extremity bypass graft, no  claudication, per patient.   HISTORY:  Diabetes:  No.  Cardiac:  No.  Hypertension:  Yes.  Smoking:  No.  Previous Surgery:  On 05/18/08, right re-do fem-pop bypass graft.   SINGLE LEVEL ARTERIAL EXAM                               RIGHT              LEFT  Brachial:                    122                130  Anterior tibial:             105                130  Posterior tibial:            130                146  Peroneal:  Ankle/brachial index:        1.0                1.12   PREVIOUS ABI:  Date: 06/11/08  RIGHT:  1.0  LEFT:  1.15   LOWER EXTREMITY BYPASS GRAFT DUPLEX EXAM:   DUPLEX:  1. Patent right femoropopliteal artery bypass graft with biphasic flow      noted proximal to, within, and distal to it.  2. Large vessel extending down right thigh noted, suggestive of      collateral vessel.   IMPRESSION:  1. Stable ankle brachial indices bilaterally.  2. Patent right femoropopliteal artery bypass graft.   ___________________________________________  V. Charlena Cross, MD   AS/MEDQ  D:  08/20/2008  T:  08/20/2008  Job:  [id number]

## 2010-08-19 NOTE — Procedures (Signed)
VASCULAR LAB EXAM   INDICATION:  Preop exam.   HISTORY:  Diabetes:  No.  Cardiac:  No.  Hypertension:  Yes.   EXAM:  Bilateral upper and lower extremity vein map.   IMPRESSION:  1. Patent and compressible right greater saphenous vein with diameter      measurements ranging from 0.17 to 0.32 cm.  2. Patent and compressible left greater saphenous vein with diameter      measurements ranging from 0.19 to 0.41 cm.  3. Patent and compressible right cephalic vein with diameter      measurements ranging from 0.31 to 0.48 cm.  4. Patent and compressible left cephalic vein with diameter      measurements ranging from 0.19 to 0.36 cm.  5. See attached work sheet for all additional measurements.     ___________________________________________  V. Charlena Cross, MD   CH/MEDQ  D:  02/20/2008  T:  02/20/2008  Job:  [id number]

## 2011-01-06 LAB — POCT I-STAT, CHEM 8
BUN: 17
Calcium, Ion: 1.18
Chloride: 100
HCT: 34 — ABNORMAL LOW
Potassium: 4.1
Sodium: 140

## 2011-01-09 LAB — BASIC METABOLIC PANEL
BUN: 9 mg/dL (ref 6–23)
Chloride: 101 mEq/L (ref 96–112)
Creatinine, Ser: 0.7 mg/dL (ref 0.4–1.5)
GFR calc non Af Amer: >60 mL/min (ref >60)
Glucose, Bld: 129 mg/dL — ABNORMAL HIGH (ref 70–99)

## 2011-01-09 LAB — POCT I-STAT 7, (LYTES, BLD GAS, ICA,H+H)
Bicarbonate: 29.2 mEq/L — ABNORMAL HIGH (ref 20.0–24.0)
HCT: 32 % — ABNORMAL LOW (ref 39.0–52.0)
Hemoglobin: 10.9 g/dL — ABNORMAL LOW (ref 13.0–17.0)
Patient temperature: 35.8
TCO2: 30 mmol/L (ref 0–100)
pCO2 arterial: 30.9 mmHg — ABNORMAL LOW (ref 35.0–45.0)
pH, Arterial: 7.58 — ABNORMAL HIGH (ref 7.350–7.450)
pO2, Arterial: 197 mmHg — ABNORMAL HIGH (ref 80.0–100.0)

## 2011-01-09 LAB — CBC
HCT: 38.2 % — ABNORMAL LOW (ref 39.0–52.0)
MCV: 81 fL (ref 78.0–100.0)
Platelets: 254 10*3/uL (ref 150–400)
Platelets: 339 10*3/uL (ref 150–400)
RBC: 4.59 MIL/uL (ref 4.22–5.81)
RDW: 16 % — ABNORMAL HIGH (ref 11.5–15.5)
WBC: 5.2 10*3/uL (ref 4.0–10.5)
WBC: 6.5 10*3/uL (ref 4.0–10.5)

## 2011-01-09 LAB — COMPREHENSIVE METABOLIC PANEL
AST: 21 U/L (ref 0–37)
Albumin: 3.1 g/dL — ABNORMAL LOW (ref 3.5–5.2)
Alkaline Phosphatase: 86 U/L (ref 39–117)
BUN: 18 mg/dL (ref 6–23)
CO2: 31 mEq/L (ref 19–32)
Chloride: 101 mEq/L (ref 96–112)
GFR calc non Af Amer: >60 mL/min (ref >60)
Potassium: 4.3 mEq/L (ref 3.5–5.1)
Total Bilirubin: 0.4 mg/dL (ref 0.3–1.2)

## 2011-01-09 LAB — ABO/RH: ABO/RH(D): A POS

## 2011-01-09 LAB — URINALYSIS, ROUTINE W REFLEX MICROSCOPIC
Bilirubin Urine: NEGATIVE
Hgb urine dipstick: NEGATIVE
Ketones, ur: NEGATIVE mg/dL
Specific Gravity, Urine: 1.021 (ref 1.005–1.030)
pH: 5.5 (ref 5.0–8.0)

## 2011-01-09 LAB — PROTIME-INR: INR: 1 (ref 0.00–1.49)

## 2011-04-18 ENCOUNTER — Encounter (HOSPITAL_COMMUNITY): Payer: Self-pay | Admitting: *Deleted

## 2011-04-18 ENCOUNTER — Emergency Department (HOSPITAL_COMMUNITY)
Admission: EM | Admit: 2011-04-18 | Discharge: 2011-04-18 | Disposition: A | Discharge status: Home / Self Care | Payer: Managed Care, Other (non HMO) | Source: Home / Self Care | Attending: Emergency Medicine | Admitting: Emergency Medicine

## 2011-04-18 DIAGNOSIS — R6889 Other general symptoms and signs: Secondary | ICD-10-CM

## 2011-04-18 HISTORY — DX: Essential (primary) hypertension: I10

## 2011-04-18 MED ORDER — HYDROCOD POLST-CHLORPHEN POLST 10-8 MG/5ML PO LQCR: 5.0000 mL | Freq: Two times a day (BID) | ORAL | Status: DC

## 2011-04-18 MED ORDER — OSELTAMIVIR PHOSPHATE 75 MG PO CAPS: 75.0000 mg | ORAL_CAPSULE | Freq: Two times a day (BID) | ORAL | Status: AC

## 2011-04-18 NOTE — ED Provider Notes (Signed)
History     CSN: [id number]  Arrival date & time 04/18/11  0910   First MD Initiated Contact with Patient 04/18/11 0920      Chief Complaint  Patient presents with  . Cough  . Fever  . Headache    (Consider location/radiation/quality/duration/timing/severity/associated sxs/prior treatment) HPI Comments: Coughing with phlegm and a sore throat since Thursday, also with a HA. bODY ACHES, CONGESTED AND RUNNY NOSE"  Patient is a 66 y.o. male presenting with cough, fever, and headaches. The history is provided by the patient.  Cough This is a new problem. The current episode started more than 2 days ago. The problem occurs constantly. The cough is productive of sputum. The fever has been present for 1 to 2 days. Associated symptoms include chills, ear congestion, headaches, rhinorrhea and sore throat. Pertinent negatives include no shortness of breath and no wheezing.  Fever Primary symptoms of the febrile illness include fever, headaches and cough. Primary symptoms do not include wheezing or shortness of breath.  Headache The primary symptoms include headaches and fever.    Past Medical History  Diagnosis Date  . Hypertension     Past Surgical History  Procedure Date  . Veinous graft from leg to wrist   . Colon surgery     R/T infection    No family history on file.  History  Substance Use Topics  . Smoking status: Never Smoker   . Smokeless tobacco: Not on file  . Alcohol Use: No      Review of Systems  Constitutional: Positive for fever and chills.  HENT: Positive for congestion, sore throat and rhinorrhea.   Respiratory: Positive for cough. Negative for shortness of breath and wheezing.   Neurological: Positive for headaches.    Allergies  Review of patient's allergies indicates no known allergies.  Home Medications   Current Outpatient Rx  Name Route Sig Dispense Refill  . ASPIRIN 81 MG PO TABS Oral Take 160 mg by mouth daily.    Marland Kitchen HYDROCHLOROTHIAZIDE  PO Oral Take by mouth.    . MULTIVITAMIN PO Oral Take by mouth.    Marland Kitchen HYDROCOD POLST-CPM POLST ER 10-8 MG/5ML PO LQCR Oral Take 5 mLs by mouth every 12 (twelve) hours. 140 mL 0  . OSELTAMIVIR PHOSPHATE 75 MG PO CAPS Oral Take 1 capsule (75 mg total) by mouth every 12 (twelve) hours. 10 capsule 0    BP 140/82  Pulse 103  Temp(Src) 101.3 F (38.5 C) (Oral)  Resp 18  SpO2 97%  Physical Exam  Constitutional: He appears well-developed and well-nourished.  HENT:  Head: Normocephalic and atraumatic.  Eyes: Conjunctivae are normal.  Neck: Normal range of motion. Neck supple. No tracheal tenderness present. No Kernig's sign noted.  Cardiovascular: Regular rhythm.  Exam reveals no gallop and no friction rub.   No murmur heard. Pulmonary/Chest: Effort normal. No respiratory distress. He has no decreased breath sounds. He has no wheezes. He has no rhonchi. He has no rales.  Abdominal: He exhibits no distension.  Lymphadenopathy:    He has no cervical adenopathy.  Skin: No rash noted.    ED Course  Procedures (including critical care time)  Labs Reviewed - No data to display No results found.   1. Influenza-like symptoms       MDM  ILI less than 72 hours since onset, NO SOB        Jimmie Molly, MD 04/18/11 [phone]

## 2011-04-18 NOTE — ED Notes (Signed)
Reports productive cough, body aches, HA since Thurs.  Was unaware of fevers.  Denies n/v/d, sore throat.  Took Robitussin without any relief.

## 2016-04-14 ENCOUNTER — Encounter: Payer: Self-pay | Admitting: Radiation Oncology

## 2016-05-05 ENCOUNTER — Encounter: Payer: Self-pay | Admitting: Radiation Oncology

## 2016-05-05 NOTE — Progress Notes (Signed)
GU Location of Tumor / Histology: prostatic adenocarcinoma   If Prostate Cancer, Gleason Score is (4 + 3) and PSA is (5.56)  Joshua Cain was originally diagnosed with low risk Gleason 6 prostate cancer in 2014. Patient opted for active surveillance  Biopsies #3:    Past/Anticipated interventions by urology, if any: biopsy, active surveillance, biopsy, referral to Dr. Tammi Klippel  Past/Anticipated interventions by medical oncology, if any: no  Weight changes, if any: no  Bowel/Bladder complaints, if any:  Reports urinary frequency and ED. IPSS 2. Denies dysuria, hematuria, or leakage. Denies any bowel complaints.  Nausea/Vomiting, if any: no  Pain issues, if any:  no  SAFETY ISSUES:  Prior radiation? no  Pacemaker/ICD? no  Possible current pregnancy? no  Is the patient on methotrexate? no  Current Complaints / other details:  71 year old male. Second marriage. Five children between the two of them. Retired. [employer]. Prior to xrt patient will need ADT and fiducials. Father, paternal uncle and son all with hx of cancer.

## 2016-05-06 ENCOUNTER — Encounter: Payer: Self-pay | Admitting: Radiation Oncology

## 2016-05-06 ENCOUNTER — Ambulatory Visit
Admission: RE | Admit: 2016-05-06 | Discharge: 2016-05-06 | Disposition: A | Discharge status: Home / Self Care | Payer: Non-veteran care | Source: Ambulatory Visit | Attending: Radiation Oncology | Admitting: Radiation Oncology

## 2016-05-06 VITALS — BP 151/83 | HR 74 | Resp 18 | Ht 71.0 in | Wt 195.8 lb

## 2016-05-06 DIAGNOSIS — Z510 Encounter for antineoplastic radiation therapy: Secondary | ICD-10-CM | POA: Insufficient documentation

## 2016-05-06 DIAGNOSIS — C61 Malignant neoplasm of prostate: Secondary | ICD-10-CM

## 2016-05-06 DIAGNOSIS — Z7982 Long term (current) use of aspirin: Secondary | ICD-10-CM | POA: Insufficient documentation

## 2016-05-06 DIAGNOSIS — I10 Essential (primary) hypertension: Secondary | ICD-10-CM | POA: Diagnosis not present

## 2016-05-06 HISTORY — DX: Malignant neoplasm of prostate: C61

## 2016-05-06 NOTE — Progress Notes (Signed)
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    ICD-9-CM ICD-10-CM   1. Malignant neoplasm of prostate ([place]) 185 C61     HISTORY OF PRESENT ILLNESS: Joshua Cain is a 71 y.o. male with a diagnosis of prostate cancer. He has been followed in active surveillance for a Gleason 3+3 adenocarcinoma since 2014 by the [place]. The patient has had two biopsies previously that continued to confirm this. His most recent PSA was 5.56. He then proceeded to repeat transrectal ultrasound with 12 biopsies of the prostate on 01/24/2016.  Out of 14 core biopsies, 6 were positive.  The maximum Gleason score was 4+3, and this was seen in the right prostate, 15%, 5% and less than 5% involvement in three of seven cores respectively. The patient saw urologist Dr. Alinda Cain on 04/14/2016 for a second opinion. Digital rectal examination was performed at that time revealing prostate 2+ in size with no nodularity.   The patient reviewed the biopsy results with his urologist and he has kindly been referred today for discussion of potential radiation treatment options.  PREVIOUS RADIATION THERAPY: No  PAST MEDICAL HISTORY:  Past Medical History:  Diagnosis Date  . Hypertension   . Prostate cancer ([place])       PAST SURGICAL HISTORY: Past Surgical History:  Procedure Laterality Date  . COLON SURGERY     R/T infection  . PROSTATE BIOPSY    . veinous graft from leg to wrist      FAMILY HISTORY:  Family History  Problem Relation Age of Onset  . Cancer Father     prostate  . Cancer Paternal Uncle     unknown  . Cancer Son     bones    SOCIAL HISTORY:  Social History   Social History    . Marital status: Married    Spouse name: N/A  . Number of children: N/A  . Years of education: N/A   Occupational History  . Not on file.   Social History Main Topics  . Smoking status: Never Smoker  . Smokeless tobacco: Never Used  . Alcohol use No  . Drug use: No  . Sexual activity: Not Currently   Other Topics Concern  . Not on file   Social History Narrative  . No narrative on file    ALLERGIES: Patient has no known allergies.  MEDICATIONS:  Current Outpatient Prescriptions  Medication Sig Dispense Refill  . aspirin 81 MG tablet Take 160 mg by mouth daily.    Marland Kitchen gabapentin (NEURONTIN) 400 MG capsule Take 400 mg by mouth 3 (three) times daily.    Marland Kitchen HYDROCHLOROTHIAZIDE PO Take by mouth.    . Multiple Vitamins-Minerals (MULTIVITAMIN PO) Take by mouth.     No current facility-administered medications for this encounter.     REVIEW OF SYSTEMS:  On review of systems, the patient reports that he is doing well overall. He denies any chest pain, shortness of breath, cough, fevers, chills, night sweats, unintended weight changes. He denies any bowel disturbances, and denies abdominal pain, nausea or vomiting. He denies any new musculoskeletal or joint aches or pains. IPSS 2 indicating mild urinary symptoms. He reports urinary frequency and longstanding  ED. He denies dysuria, hematuria, or leakage. He is not able to complete sexual activity and has failed multiple medical therapies including oral medications, vaccume erection device and intraurethral suppositories. A complete review of systems is obtained and is otherwise negative.   PHYSICAL EXAM:  Wt Readings from Last 3 Encounters:  05/06/16 195 lb 12.8 oz (88.8 kg)  03/03/10 180 lb 8 oz (81.9 kg)  06/12/08 179 lb 8 oz (81.4 kg)   Temp Readings from Last 3 Encounters:  04/18/11 101.3 F (38.5 C) (Oral)   BP Readings from Last 3 Encounters:  05/06/16 (!) 151/83  04/18/11 140/82  03/03/10 112/72   Pulse Readings from  Last 3 Encounters:  05/06/16 74  04/18/11 103  03/03/10 82    Pain Scale 0/10 In general this is a well appearing African American male in no acute distress. He is alert and oriented x4 and appropriate throughout the examination. HEENT reveals that the patient is normocephalic, atraumatic. EOMs are intact. PERRLA. Skin is intact without any evidence of gross lesions. Cardiovascular exam reveals a regular rate and rhythm, no clicks rubs or murmurs are auscultated. Chest is clear to auscultation bilaterally. Lymphatic assessment is performed and does not reveal any adenopathy in the cervical, supraclavicular or axillary chains. Abdomen has active bowel sounds in all quadrants and is intact. The abdomen is soft, non tender, non distended. Lower extremities are negative for pretibial pitting edema, deep calf tenderness, cyanosis or clubbing.   KPS = 100  100 - Normal; no complaints; no evidence of disease. 90   - Able to carry on normal activity; minor signs or symptoms of disease. 80   - Normal activity with effort; some signs or symptoms of disease. 72   - Cares for self; unable to carry on normal activity or to do active work. 60   - Requires occasional assistance, but is able to care for most of his personal needs. 50   - Requires considerable assistance and frequent medical care. 31   - Disabled; requires special care and assistance. 62   - Severely disabled; hospital admission is indicated although death not imminent. 102   - Very sick; hospital admission necessary; active supportive treatment necessary. 10   - Moribund; fatal processes progressing rapidly. 0     - Dead  Karnofsky DA, Abelmann [place], Craver LS and Burchenal [facility] [phone]) The use of the nitrogen mustards in the palliative treatment of carcinoma: with particular reference to bronchogenic carcinoma Cancer 1 [phone]  LABORATORY DATA:  Lab Results  Component Value Date   WBC 6.6 02/25/2010   HGB 14.3 02/25/2010   HCT 41.8  02/25/2010   MCV 87.8 02/25/2010   PLT 184 02/25/2010   Lab Results  Component Value Date   NA 141 02/25/2010   K 3.2 (L) 02/25/2010   CL 105 02/25/2010   CO2 27 02/25/2010   Lab Results  Component Value Date   ALT 14 06/12/2008   AST 17 06/12/2008   ALKPHOS 93 06/12/2008   BILITOT 0.7 06/12/2008     RADIOGRAPHY: No results found.    IMPRESSION/PLAN: 1. 71 y.o. gentleman with intermediate risk, Stage T1c adenocarcinoma of the prostate, Gleason's score 4+3, PSA 5.56.  We discussed the natural history of prostate cancer.  We reviewed the the implications of T-stage, Gleason's Score, and PSA on decision-making and outcomes in prostate cancer.  We discussed radiation treatment in the management of prostate cancer with regard to the logistics and delivery of external beam radiation  treatment as well as the logistics and delivery of androgen deprivation therapy.  We compared and contrasted each of these approaches and also compared these against prostatectomy.  The patient expressed interest in external beam radiotherapy.   The patient would like to proceed with prostate IMRT.  We will share our findings with Dr. Alinda Cain and move forward with initiation of androgen deprivation ST-ADT now and scheduling placement of three gold fiducial markers into the prostate in 6-8 weeks to proceed with IMRT in the near future.     We enjoyed meeting with him today, and will look forward to participating in the care of this very nice gentleman.  Freeman Caldron, PA-C  And  ------------------------------------------------   Tyler Pita, MD [place] Director and Director of Stereotactic Radiosurgery Direct Dial: [phone]  Fax: [phone] [email or web address]  Skype  [email or web address]  This document serves as a record of services personally performed by Tyler Pita, MD and Freeman Caldron, PAC. It was created on their behalf by Arlyce Harman, a trained medical  scribe. The creation of this record is based on the scribe's personal observations and the provider's statements to them. This document has been checked and approved by the attending provider.

## 2016-05-06 NOTE — Progress Notes (Signed)
See progress note under physician encounter. 

## 2016-05-15 ENCOUNTER — Telehealth: Payer: Self-pay | Admitting: *Deleted

## 2016-05-15 NOTE — Telephone Encounter (Signed)
Called patient to inform of sim appt., lvm for a return call

## 2016-05-15 NOTE — Telephone Encounter (Signed)
CALLED PATIENT TO INFORM OF SIM APPT. ON 06-25-16 @ 9 AM @ DR. MANNING'S OFFICE, LVM FOR A RETURN CALL

## 2016-05-21 ENCOUNTER — Encounter: Payer: Self-pay | Admitting: Medical Oncology

## 2016-05-21 NOTE — Progress Notes (Signed)
I was not able to meet Joshua Cain the day he consulted with Dr. Tammi Klippel. I called him to introduce myself as the navigator for the prostate patients. He states he got his hormone injection and his gold markers for radiation placed 05/14/16. We discussed the side effects of androgen deprivation and what to expect. He is aware of his CT simulation 06/25/16. I asked him to call me with questions or concerns.

## 2016-06-24 NOTE — Progress Notes (Signed)
  Radiation Oncology         [phone]) [phone] ________________________________  Name: Joshua Cain MRN: [id number]  Date: 06/25/2016  DOB: Sep 02, 1945  SIMULATION AND TREATMENT PLANNING NOTE    ICD-9-CM ICD-10-CM   1. Malignant neoplasm of prostate ([place]) 185 C61     DIAGNOSIS:  71 year-old gentleman with Stage T1c adenocarcinoma of the prostate, Gleason's score 4+3, PSA 5.56  NARRATIVE:  The patient was brought to the [facility].  Identity was confirmed.  All relevant records and images related to the planned course of therapy were reviewed.  The patient freely provided informed written consent to proceed with treatment after reviewing the details related to the planned course of therapy. The consent form was witnessed and verified by the simulation staff.  Then, the patient was set-up in a stable reproducible supine position for radiation therapy.  A vacuum lock pillow device was custom fabricated to position his legs in a reproducible immobilized position.  Then, I performed a urethrogram under sterile conditions to identify the prostatic apex.  CT images were obtained.  Surface markings were placed.  The CT images were loaded into the planning software.  Then the prostate target and avoidance structures including the rectum, bladder, bowel and hips were contoured.  Treatment planning then occurred.  The radiation prescription was entered and confirmed.  A total of 1 complex treatment devices were fabricated. I have requested : Intensity Modulated Radiotherapy (IMRT) is medically necessary for this case for the following reason:  Rectal sparing.Marland Kitchen  PLAN:  The patient will receive 78 Gy in 40 fractions.  ________________________________  Sheral Apley Tammi Klippel, M.D.

## 2016-06-25 ENCOUNTER — Ambulatory Visit
Admission: RE | Admit: 2016-06-25 | Discharge: 2016-06-25 | Disposition: A | Discharge status: Home / Self Care | Payer: Non-veteran care | Source: Ambulatory Visit | Attending: Radiation Oncology | Admitting: Radiation Oncology

## 2016-06-25 DIAGNOSIS — Z510 Encounter for antineoplastic radiation therapy: Secondary | ICD-10-CM | POA: Diagnosis not present

## 2016-06-25 DIAGNOSIS — C61 Malignant neoplasm of prostate: Secondary | ICD-10-CM

## 2016-06-29 DIAGNOSIS — Z510 Encounter for antineoplastic radiation therapy: Secondary | ICD-10-CM | POA: Diagnosis not present

## 2016-07-06 ENCOUNTER — Ambulatory Visit
Admission: RE | Admit: 2016-07-06 | Discharge: 2016-07-06 | Disposition: A | Discharge status: Home / Self Care | Payer: Non-veteran care | Source: Ambulatory Visit | Attending: Radiation Oncology | Admitting: Radiation Oncology

## 2016-07-06 DIAGNOSIS — Z510 Encounter for antineoplastic radiation therapy: Secondary | ICD-10-CM | POA: Diagnosis not present

## 2016-07-07 ENCOUNTER — Ambulatory Visit
Admission: RE | Admit: 2016-07-07 | Discharge: 2016-07-07 | Disposition: A | Discharge status: Home / Self Care | Payer: Non-veteran care | Source: Ambulatory Visit | Attending: Radiation Oncology | Admitting: Radiation Oncology

## 2016-07-07 DIAGNOSIS — Z510 Encounter for antineoplastic radiation therapy: Secondary | ICD-10-CM | POA: Diagnosis not present

## 2016-07-08 ENCOUNTER — Ambulatory Visit
Admission: RE | Admit: 2016-07-08 | Discharge: 2016-07-08 | Disposition: A | Discharge status: Home / Self Care | Payer: Non-veteran care | Source: Ambulatory Visit | Attending: Radiation Oncology | Admitting: Radiation Oncology

## 2016-07-08 DIAGNOSIS — Z510 Encounter for antineoplastic radiation therapy: Secondary | ICD-10-CM | POA: Diagnosis not present

## 2016-07-09 ENCOUNTER — Ambulatory Visit
Admission: RE | Admit: 2016-07-09 | Discharge: 2016-07-09 | Disposition: A | Discharge status: Home / Self Care | Payer: Non-veteran care | Source: Ambulatory Visit | Attending: Radiation Oncology | Admitting: Radiation Oncology

## 2016-07-09 DIAGNOSIS — Z510 Encounter for antineoplastic radiation therapy: Secondary | ICD-10-CM | POA: Diagnosis not present

## 2016-07-10 ENCOUNTER — Ambulatory Visit
Admission: RE | Admit: 2016-07-10 | Discharge: 2016-07-10 | Disposition: A | Discharge status: Home / Self Care | Payer: Non-veteran care | Source: Ambulatory Visit | Attending: Radiation Oncology | Admitting: Radiation Oncology

## 2016-07-10 VITALS — BP 139/84 | HR 105 | Resp 16 | Wt 197.2 lb

## 2016-07-10 DIAGNOSIS — C61 Malignant neoplasm of prostate: Secondary | ICD-10-CM

## 2016-07-10 DIAGNOSIS — Z510 Encounter for antineoplastic radiation therapy: Secondary | ICD-10-CM | POA: Diagnosis not present

## 2016-07-10 NOTE — Progress Notes (Signed)
  Radiation Oncology         [phone] ________________________________  Name: Joshua Cain MRN: [id number]  Date: 07/10/2016  DOB: 07/20/1945    Weekly Radiation Therapy Management    ICD-9-CM ICD-10-CM   1. Malignant neoplasm of prostate ([place]) 185 C61      Current Dose: 9.75 Gy     Planned Dose:  78 Gy  Narrative . . . . . . . . The patient presents for routine under treatment assessment.                                 Weight and vitals stable. Denies pain. Reports urinary frequency. Reports nocturia x 1-2. Denies dysuria or hematuria. Denies urinary leakage or incontinence. Describes a strong steady urine stream without difficulty emptying his bladder. Denies urgency or hesitancy. Denies any bowel complaints. Denies fatigue. He reports cold sweats associated with hormone shots.                                  Set-up films were reviewed.                                 The chart was checked. Physical Findings. . .  weight is 197 lb 3.2 oz (89.4 kg). His blood pressure is 139/84 and his pulse is 105 (abnormal). His respiration is 16 and oxygen saturation is 100%. . Weight essentially stable.  No significant changes. Lungs are clear to auscultation bilaterally. Heart has regular rate and rhythm Abdomen soft, non-tender, normal bowel sounds. Impression . . . . . . . The patient is tolerating radiation. Plan . . . . . . . . . . . . Continue treatment as planned.  ________________________________   Blair Promise, PhD, MD  This document serves as a record of services personally performed by Gery Pray, MD. It was created on his behalf by Arlyce Harman, a trained medical scribe. The creation of this record is based on the scribe's personal observations and the provider's statements to them. This document has been checked and approved by the attending provider.

## 2016-07-10 NOTE — Progress Notes (Signed)
Weight and vitals stable. Denies pain. Reports urinary frequency. Reports nocturia x 1-2. Denies dysuria or hematuria. Denies urinary leakage or incontinence. Describes a strong steady urine stream without difficulty emptying his bladder. Denies urgency or hesitancy. Denies any bowel complaints. Denies fatigue.   BP 139/84 (BP Location: Left Arm, Patient Position: Sitting, Cuff Size: Normal)   Pulse (!) 105   Resp 16   Wt 197 lb 3.2 oz (89.4 kg)   SpO2 100%   BMI 27.50 kg/m  Wt Readings from Last 3 Encounters:  07/10/16 197 lb 3.2 oz (89.4 kg)  05/06/16 195 lb 12.8 oz (88.8 kg)  03/03/10 180 lb 8 oz (81.9 kg)

## 2016-07-13 ENCOUNTER — Ambulatory Visit
Admission: RE | Admit: 2016-07-13 | Discharge: 2016-07-13 | Disposition: A | Discharge status: Home / Self Care | Payer: Non-veteran care | Source: Ambulatory Visit | Attending: Radiation Oncology | Admitting: Radiation Oncology

## 2016-07-13 DIAGNOSIS — Z510 Encounter for antineoplastic radiation therapy: Secondary | ICD-10-CM | POA: Diagnosis not present

## 2016-07-14 ENCOUNTER — Ambulatory Visit
Admission: RE | Admit: 2016-07-14 | Discharge: 2016-07-14 | Disposition: A | Discharge status: Home / Self Care | Payer: Non-veteran care | Source: Ambulatory Visit | Attending: Radiation Oncology | Admitting: Radiation Oncology

## 2016-07-14 DIAGNOSIS — Z510 Encounter for antineoplastic radiation therapy: Secondary | ICD-10-CM | POA: Diagnosis not present

## 2016-07-15 ENCOUNTER — Ambulatory Visit
Admission: RE | Admit: 2016-07-15 | Discharge: 2016-07-15 | Disposition: A | Discharge status: Home / Self Care | Payer: Non-veteran care | Source: Ambulatory Visit | Attending: Radiation Oncology | Admitting: Radiation Oncology

## 2016-07-15 DIAGNOSIS — Z510 Encounter for antineoplastic radiation therapy: Secondary | ICD-10-CM | POA: Diagnosis not present

## 2016-07-16 ENCOUNTER — Ambulatory Visit
Admission: RE | Admit: 2016-07-16 | Discharge: 2016-07-16 | Disposition: A | Discharge status: Home / Self Care | Payer: Non-veteran care | Source: Ambulatory Visit | Attending: Radiation Oncology | Admitting: Radiation Oncology

## 2016-07-16 DIAGNOSIS — Z510 Encounter for antineoplastic radiation therapy: Secondary | ICD-10-CM | POA: Diagnosis not present

## 2016-07-17 ENCOUNTER — Ambulatory Visit
Admission: RE | Admit: 2016-07-17 | Discharge: 2016-07-17 | Disposition: A | Discharge status: Home / Self Care | Payer: Non-veteran care | Source: Ambulatory Visit | Attending: Radiation Oncology | Admitting: Radiation Oncology

## 2016-07-17 DIAGNOSIS — Z510 Encounter for antineoplastic radiation therapy: Secondary | ICD-10-CM | POA: Diagnosis not present

## 2016-07-20 ENCOUNTER — Ambulatory Visit
Admission: RE | Admit: 2016-07-20 | Discharge: 2016-07-20 | Disposition: A | Discharge status: Home / Self Care | Payer: Non-veteran care | Source: Ambulatory Visit | Attending: Radiation Oncology | Admitting: Radiation Oncology

## 2016-07-20 DIAGNOSIS — Z510 Encounter for antineoplastic radiation therapy: Secondary | ICD-10-CM | POA: Diagnosis not present

## 2016-07-21 ENCOUNTER — Ambulatory Visit
Admission: RE | Admit: 2016-07-21 | Discharge: 2016-07-21 | Disposition: A | Discharge status: Home / Self Care | Payer: Non-veteran care | Source: Ambulatory Visit | Attending: Radiation Oncology | Admitting: Radiation Oncology

## 2016-07-21 DIAGNOSIS — Z510 Encounter for antineoplastic radiation therapy: Secondary | ICD-10-CM | POA: Diagnosis not present

## 2016-07-22 ENCOUNTER — Ambulatory Visit
Admission: RE | Admit: 2016-07-22 | Discharge: 2016-07-22 | Disposition: A | Discharge status: Home / Self Care | Payer: Non-veteran care | Source: Ambulatory Visit | Attending: Radiation Oncology | Admitting: Radiation Oncology

## 2016-07-22 DIAGNOSIS — Z510 Encounter for antineoplastic radiation therapy: Secondary | ICD-10-CM | POA: Diagnosis not present

## 2016-07-23 ENCOUNTER — Ambulatory Visit
Admission: RE | Admit: 2016-07-23 | Discharge: 2016-07-23 | Disposition: A | Discharge status: Home / Self Care | Payer: Non-veteran care | Source: Ambulatory Visit | Attending: Radiation Oncology | Admitting: Radiation Oncology

## 2016-07-23 DIAGNOSIS — Z510 Encounter for antineoplastic radiation therapy: Secondary | ICD-10-CM | POA: Diagnosis not present

## 2016-07-24 ENCOUNTER — Ambulatory Visit
Admission: RE | Admit: 2016-07-24 | Discharge: 2016-07-24 | Disposition: A | Discharge status: Home / Self Care | Payer: Non-veteran care | Source: Ambulatory Visit | Attending: Radiation Oncology | Admitting: Radiation Oncology

## 2016-07-24 DIAGNOSIS — Z510 Encounter for antineoplastic radiation therapy: Secondary | ICD-10-CM | POA: Diagnosis not present

## 2016-07-27 ENCOUNTER — Ambulatory Visit: Payer: Non-veteran care

## 2016-07-28 ENCOUNTER — Ambulatory Visit
Admission: RE | Admit: 2016-07-28 | Discharge: 2016-07-28 | Disposition: A | Discharge status: Home / Self Care | Payer: Non-veteran care | Source: Ambulatory Visit | Attending: Radiation Oncology | Admitting: Radiation Oncology

## 2016-07-28 DIAGNOSIS — Z510 Encounter for antineoplastic radiation therapy: Secondary | ICD-10-CM | POA: Diagnosis not present

## 2016-07-29 ENCOUNTER — Ambulatory Visit
Admission: RE | Admit: 2016-07-29 | Discharge: 2016-07-29 | Disposition: A | Discharge status: Home / Self Care | Payer: Non-veteran care | Source: Ambulatory Visit | Attending: Radiation Oncology | Admitting: Radiation Oncology

## 2016-07-29 DIAGNOSIS — Z510 Encounter for antineoplastic radiation therapy: Secondary | ICD-10-CM | POA: Diagnosis not present

## 2016-07-30 ENCOUNTER — Ambulatory Visit
Admission: RE | Admit: 2016-07-30 | Discharge: 2016-07-30 | Disposition: A | Discharge status: Home / Self Care | Payer: Non-veteran care | Source: Ambulatory Visit | Attending: Radiation Oncology | Admitting: Radiation Oncology

## 2016-07-30 DIAGNOSIS — Z510 Encounter for antineoplastic radiation therapy: Secondary | ICD-10-CM | POA: Diagnosis not present

## 2016-07-31 ENCOUNTER — Ambulatory Visit
Admission: RE | Admit: 2016-07-31 | Discharge: 2016-07-31 | Disposition: A | Discharge status: Home / Self Care | Payer: Non-veteran care | Source: Ambulatory Visit | Attending: Radiation Oncology | Admitting: Radiation Oncology

## 2016-07-31 ENCOUNTER — Encounter: Payer: Self-pay | Admitting: Medical Oncology

## 2016-07-31 DIAGNOSIS — Z510 Encounter for antineoplastic radiation therapy: Secondary | ICD-10-CM | POA: Diagnosis not present

## 2016-07-31 NOTE — Progress Notes (Signed)
Joshua Cain states he is doing well with treatments. No major side effects. I will continue to follow.

## 2016-08-03 ENCOUNTER — Ambulatory Visit
Admission: RE | Admit: 2016-08-03 | Discharge: 2016-08-03 | Disposition: A | Discharge status: Home / Self Care | Payer: Non-veteran care | Source: Ambulatory Visit | Attending: Radiation Oncology | Admitting: Radiation Oncology

## 2016-08-03 DIAGNOSIS — Z510 Encounter for antineoplastic radiation therapy: Secondary | ICD-10-CM | POA: Diagnosis not present

## 2016-08-04 ENCOUNTER — Ambulatory Visit
Admission: RE | Admit: 2016-08-04 | Discharge: 2016-08-04 | Disposition: A | Discharge status: Home / Self Care | Payer: Non-veteran care | Source: Ambulatory Visit | Attending: Radiation Oncology | Admitting: Radiation Oncology

## 2016-08-04 DIAGNOSIS — C61 Malignant neoplasm of prostate: Secondary | ICD-10-CM | POA: Insufficient documentation

## 2016-08-04 DIAGNOSIS — Z79899 Other long term (current) drug therapy: Secondary | ICD-10-CM | POA: Diagnosis not present

## 2016-08-04 DIAGNOSIS — Z510 Encounter for antineoplastic radiation therapy: Secondary | ICD-10-CM | POA: Diagnosis present

## 2016-08-04 DIAGNOSIS — Z7982 Long term (current) use of aspirin: Secondary | ICD-10-CM | POA: Insufficient documentation

## 2016-08-05 ENCOUNTER — Ambulatory Visit
Admission: RE | Admit: 2016-08-05 | Discharge: 2016-08-05 | Disposition: A | Discharge status: Home / Self Care | Payer: Non-veteran care | Source: Ambulatory Visit | Attending: Radiation Oncology | Admitting: Radiation Oncology

## 2016-08-05 DIAGNOSIS — Z510 Encounter for antineoplastic radiation therapy: Secondary | ICD-10-CM | POA: Diagnosis not present

## 2016-08-06 ENCOUNTER — Ambulatory Visit
Admission: RE | Admit: 2016-08-06 | Discharge: 2016-08-06 | Disposition: A | Discharge status: Home / Self Care | Payer: Non-veteran care | Source: Ambulatory Visit | Attending: Radiation Oncology | Admitting: Radiation Oncology

## 2016-08-06 DIAGNOSIS — Z510 Encounter for antineoplastic radiation therapy: Secondary | ICD-10-CM | POA: Diagnosis not present

## 2016-08-07 ENCOUNTER — Ambulatory Visit
Admission: RE | Admit: 2016-08-07 | Discharge: 2016-08-07 | Disposition: A | Discharge status: Home / Self Care | Payer: Non-veteran care | Source: Ambulatory Visit | Attending: Radiation Oncology | Admitting: Radiation Oncology

## 2016-08-07 DIAGNOSIS — Z510 Encounter for antineoplastic radiation therapy: Secondary | ICD-10-CM | POA: Diagnosis not present

## 2016-08-10 ENCOUNTER — Ambulatory Visit
Admission: RE | Admit: 2016-08-10 | Discharge: 2016-08-10 | Disposition: A | Discharge status: Home / Self Care | Payer: Non-veteran care | Source: Ambulatory Visit | Attending: Radiation Oncology | Admitting: Radiation Oncology

## 2016-08-10 DIAGNOSIS — Z510 Encounter for antineoplastic radiation therapy: Secondary | ICD-10-CM | POA: Diagnosis not present

## 2016-08-11 ENCOUNTER — Ambulatory Visit
Admission: RE | Admit: 2016-08-11 | Discharge: 2016-08-11 | Disposition: A | Discharge status: Home / Self Care | Payer: Non-veteran care | Source: Ambulatory Visit | Attending: Radiation Oncology | Admitting: Radiation Oncology

## 2016-08-11 DIAGNOSIS — Z510 Encounter for antineoplastic radiation therapy: Secondary | ICD-10-CM | POA: Diagnosis not present

## 2016-08-12 ENCOUNTER — Ambulatory Visit
Admission: RE | Admit: 2016-08-12 | Discharge: 2016-08-12 | Disposition: A | Discharge status: Home / Self Care | Payer: Non-veteran care | Source: Ambulatory Visit | Attending: Radiation Oncology | Admitting: Radiation Oncology

## 2016-08-12 DIAGNOSIS — Z510 Encounter for antineoplastic radiation therapy: Secondary | ICD-10-CM | POA: Diagnosis not present

## 2016-08-13 ENCOUNTER — Ambulatory Visit
Admission: RE | Admit: 2016-08-13 | Discharge: 2016-08-13 | Disposition: A | Discharge status: Home / Self Care | Payer: Non-veteran care | Source: Ambulatory Visit | Attending: Radiation Oncology | Admitting: Radiation Oncology

## 2016-08-13 DIAGNOSIS — Z510 Encounter for antineoplastic radiation therapy: Secondary | ICD-10-CM | POA: Diagnosis not present

## 2016-08-14 ENCOUNTER — Ambulatory Visit
Admission: RE | Admit: 2016-08-14 | Discharge: 2016-08-14 | Disposition: A | Discharge status: Home / Self Care | Payer: Non-veteran care | Source: Ambulatory Visit | Attending: Radiation Oncology | Admitting: Radiation Oncology

## 2016-08-14 DIAGNOSIS — Z510 Encounter for antineoplastic radiation therapy: Secondary | ICD-10-CM | POA: Diagnosis not present

## 2016-08-17 ENCOUNTER — Ambulatory Visit
Admission: RE | Admit: 2016-08-17 | Discharge: 2016-08-17 | Disposition: A | Discharge status: Home / Self Care | Payer: Non-veteran care | Source: Ambulatory Visit | Attending: Radiation Oncology | Admitting: Radiation Oncology

## 2016-08-17 DIAGNOSIS — Z510 Encounter for antineoplastic radiation therapy: Secondary | ICD-10-CM | POA: Diagnosis not present

## 2016-08-18 ENCOUNTER — Ambulatory Visit
Admission: RE | Admit: 2016-08-18 | Discharge: 2016-08-18 | Disposition: A | Discharge status: Home / Self Care | Payer: Non-veteran care | Source: Ambulatory Visit | Attending: Radiation Oncology | Admitting: Radiation Oncology

## 2016-08-18 DIAGNOSIS — Z510 Encounter for antineoplastic radiation therapy: Secondary | ICD-10-CM | POA: Diagnosis not present

## 2016-08-19 ENCOUNTER — Ambulatory Visit
Admission: RE | Admit: 2016-08-19 | Discharge: 2016-08-19 | Disposition: A | Discharge status: Home / Self Care | Payer: Non-veteran care | Source: Ambulatory Visit | Attending: Radiation Oncology | Admitting: Radiation Oncology

## 2016-08-19 DIAGNOSIS — Z510 Encounter for antineoplastic radiation therapy: Secondary | ICD-10-CM | POA: Diagnosis not present

## 2016-08-20 ENCOUNTER — Ambulatory Visit
Admission: RE | Admit: 2016-08-20 | Discharge: 2016-08-20 | Disposition: A | Discharge status: Home / Self Care | Payer: Non-veteran care | Source: Ambulatory Visit | Attending: Radiation Oncology | Admitting: Radiation Oncology

## 2016-08-20 DIAGNOSIS — Z510 Encounter for antineoplastic radiation therapy: Secondary | ICD-10-CM | POA: Diagnosis not present

## 2016-08-21 ENCOUNTER — Ambulatory Visit
Admission: RE | Admit: 2016-08-21 | Discharge: 2016-08-21 | Disposition: A | Discharge status: Home / Self Care | Payer: Non-veteran care | Source: Ambulatory Visit | Attending: Radiation Oncology | Admitting: Radiation Oncology

## 2016-08-21 DIAGNOSIS — Z510 Encounter for antineoplastic radiation therapy: Secondary | ICD-10-CM | POA: Diagnosis not present

## 2016-08-24 ENCOUNTER — Ambulatory Visit
Admission: RE | Admit: 2016-08-24 | Discharge: 2016-08-24 | Disposition: A | Discharge status: Home / Self Care | Payer: Non-veteran care | Source: Ambulatory Visit | Attending: Radiation Oncology | Admitting: Radiation Oncology

## 2016-08-24 DIAGNOSIS — Z510 Encounter for antineoplastic radiation therapy: Secondary | ICD-10-CM | POA: Diagnosis not present

## 2016-08-25 ENCOUNTER — Ambulatory Visit
Admission: RE | Admit: 2016-08-25 | Discharge: 2016-08-25 | Disposition: A | Discharge status: Home / Self Care | Payer: Non-veteran care | Source: Ambulatory Visit | Attending: Radiation Oncology | Admitting: Radiation Oncology

## 2016-08-25 DIAGNOSIS — Z510 Encounter for antineoplastic radiation therapy: Secondary | ICD-10-CM | POA: Diagnosis not present

## 2016-08-26 ENCOUNTER — Ambulatory Visit
Admission: RE | Admit: 2016-08-26 | Discharge: 2016-08-26 | Disposition: A | Discharge status: Home / Self Care | Payer: Non-veteran care | Source: Ambulatory Visit | Attending: Radiation Oncology | Admitting: Radiation Oncology

## 2016-08-26 DIAGNOSIS — Z510 Encounter for antineoplastic radiation therapy: Secondary | ICD-10-CM | POA: Diagnosis not present

## 2016-08-27 ENCOUNTER — Ambulatory Visit
Admission: RE | Admit: 2016-08-27 | Discharge: 2016-08-27 | Disposition: A | Discharge status: Home / Self Care | Payer: Non-veteran care | Source: Ambulatory Visit | Attending: Radiation Oncology | Admitting: Radiation Oncology

## 2016-08-27 DIAGNOSIS — Z510 Encounter for antineoplastic radiation therapy: Secondary | ICD-10-CM | POA: Diagnosis not present

## 2016-08-28 ENCOUNTER — Ambulatory Visit
Admission: RE | Admit: 2016-08-28 | Discharge: 2016-08-28 | Disposition: A | Discharge status: Home / Self Care | Payer: Non-veteran care | Source: Ambulatory Visit | Attending: Radiation Oncology | Admitting: Radiation Oncology

## 2016-08-28 ENCOUNTER — Ambulatory Visit: Payer: Non-veteran care

## 2016-08-28 DIAGNOSIS — Z510 Encounter for antineoplastic radiation therapy: Secondary | ICD-10-CM | POA: Diagnosis not present

## 2016-08-31 ENCOUNTER — Ambulatory Visit: Payer: Non-veteran care

## 2016-09-01 ENCOUNTER — Ambulatory Visit
Admission: RE | Admit: 2016-09-01 | Discharge: 2016-09-01 | Disposition: A | Discharge status: Home / Self Care | Payer: Non-veteran care | Source: Ambulatory Visit | Attending: Radiation Oncology | Admitting: Radiation Oncology

## 2016-09-01 DIAGNOSIS — Z510 Encounter for antineoplastic radiation therapy: Secondary | ICD-10-CM | POA: Diagnosis not present

## 2016-09-02 ENCOUNTER — Encounter: Payer: Self-pay | Admitting: Radiation Oncology

## 2016-09-02 NOTE — Progress Notes (Signed)
  Radiation Oncology         [phone] ________________________________  Name: Joshua Cain MRN: [id number]  Date: 09/02/2016  DOB: 07/27/1945  End of Treatment Note  Diagnosis:   Stage T1c adenocarcinoma of the prostate, Gleason's score 4+3, PSA 5.56.     Indication for treatment:  Curative, Definitive Radiotherapy       Radiation treatment dates:   07/06/16 - 09/01/16  Site/dose:   The prostate was treated to 78 Gy in 40 fractions of 1.95 Gy  Beams/energy:   The patient was treated with IMRT using volumetric arc therapy delivering 6 MV X-rays to clockwise and counterclockwise circumferential arcs with a 90 degree collimator offset to avoid dose scalloping.  Image guidance was performed with daily cone beam CT prior to each fraction to align to gold markers in the prostate and assure proper bladder and rectal fill volumes.  Immobilization was achieved with BodyFix custom mold.  Narrative: The patient tolerated radiation treatment relatively well.   The patient experienced some minor urinary irritation and modest fatigue.    Plan: The patient has completed radiation treatment. He will return to radiation oncology clinic for routine followup in one month. I advised him to call or return sooner if he has any questions or concerns related to his recovery or treatment. ________________________________  Sheral Apley. Tammi Klippel, M.D.  This document serves as a record of services personally performed by Tyler Pita, MD. It was created on his behalf by Linward Natal, a trained medical scribe. The creation of this record is based on the scribe's personal observations and the provider's statements to them. This document has been checked and approved by the attending provider.

## 2016-10-16 ENCOUNTER — Ambulatory Visit
Admission: RE | Admit: 2016-10-16 | Discharge: 2016-10-16 | Disposition: A | Discharge status: Home / Self Care | Payer: Non-veteran care | Source: Ambulatory Visit | Attending: Urology | Admitting: Urology

## 2016-10-16 ENCOUNTER — Encounter: Payer: Self-pay | Admitting: Urology

## 2016-10-16 VITALS — Temp 98.2°F

## 2016-10-16 DIAGNOSIS — Z510 Encounter for antineoplastic radiation therapy: Secondary | ICD-10-CM | POA: Diagnosis not present

## 2016-10-16 DIAGNOSIS — C61 Malignant neoplasm of prostate: Secondary | ICD-10-CM

## 2016-10-16 NOTE — Progress Notes (Signed)
  Radiation Oncology         [phone]) [phone] ________________________________  Name: Joshua Cain MRN: [id number]  Date: 10/16/2016  DOB: 1946-01-28  Post Treatment Note  CC: Biagio Borg, MD  [name], MD  Diagnosis:  71 year-old gentleman with Stage T1c adenocarcinoma of the prostate, Gleason's score 4+3, PSA 5.56.  Interval Since Last Radiation:  8 weeks  07/06/16 - 09/01/16: The prostate was treated to 78 Gy in 40 fractions of 1.95 Gy  Narrative:  The patient returns today for routine follow-up.  He tolerated radiation treatment relatively well with only minor urinary irritation and modest fatigue.                             On review of systems, the patient states that he is doing well overall.  He specifically denies dysuria, gross hematuria, frequency, urgency, fever or chills.  He has nocturia 2x/night which is manageable.  He continues on Lupron for ADT which he tolerates relatively well aside from frequent hot flashes.  He reports that his energy level is gradually improving.  He has a healthy appetite and is maintaining his weight. He denies abdominal pain, N/V or diarrhea.  ALLERGIES:  has No Known Allergies.  Meds: Current Outpatient Prescriptions  Medication Sig Dispense Refill  . aspirin 81 MG tablet Take 160 mg by mouth daily.    Marland Kitchen gabapentin (NEURONTIN) 400 MG capsule Take 400 mg by mouth 3 (three) times daily.    Marland Kitchen HYDROCHLOROTHIAZIDE PO Take by mouth.    . Multiple Vitamins-Minerals (MULTIVITAMIN PO) Take by mouth.     No current facility-administered medications for this encounter.     Physical Findings:  oral temperature is 98.2 F (36.8 C).  Pain Assessment Pain Score: 6  (Low back into left hip)/10 In general this is a well appearing african [place] male in no acute distress. He's alert and oriented x4 and appropriate throughout the examination. Cardiopulmonary assessment is negative for acute distress and he exhibits normal effort.   Lab  Findings: Lab Results  Component Value Date   WBC 6.6 02/25/2010   HGB 14.3 02/25/2010   HCT 41.8 02/25/2010   MCV 87.8 02/25/2010   PLT 184 02/25/2010     Radiographic Findings: No results found.  Impression/Plan: 30. 71 year-old gentleman with Stage T1c adenocarcinoma of the prostate, Gleason's score 4+3, PSA 5.56. He will continue to follow up with urology for ongoing PSA determinations and has an appointment scheduled with Dr. Alinda Money on 12/09/16. He understands what to expect with regards to PSA monitoring going forward. We will look forward to following his response to treatment via correspondence with urology, and would be happy to continue to participate in his care if clinically indicated. I talked to the patient about what to expect in the future, including his risk for erectile dysfunction rectal bleeding. I encouraged him to call or return to the office if he has any questions regarding his previous radiation or possible radiation side effects. He was comfortable with this plan and will follow up as needed.   Nicholos Johns, PA-C

## 2017-08-27 ENCOUNTER — Emergency Department (HOSPITAL_COMMUNITY)
Admission: EM | Admit: 2017-08-27 | Discharge: 2017-08-27 | Disposition: A | Discharge status: Home / Self Care | Payer: Medicare Other | Attending: Emergency Medicine | Admitting: Emergency Medicine

## 2017-08-27 ENCOUNTER — Encounter: Payer: Self-pay | Admitting: Physician Assistant

## 2017-08-27 ENCOUNTER — Encounter (HOSPITAL_COMMUNITY): Payer: Self-pay | Admitting: *Deleted

## 2017-08-27 DIAGNOSIS — Z79899 Other long term (current) drug therapy: Secondary | ICD-10-CM | POA: Diagnosis not present

## 2017-08-27 DIAGNOSIS — Z7982 Long term (current) use of aspirin: Secondary | ICD-10-CM | POA: Insufficient documentation

## 2017-08-27 DIAGNOSIS — I10 Essential (primary) hypertension: Secondary | ICD-10-CM | POA: Diagnosis not present

## 2017-08-27 DIAGNOSIS — Z8546 Personal history of malignant neoplasm of prostate: Secondary | ICD-10-CM | POA: Diagnosis not present

## 2017-08-27 DIAGNOSIS — K625 Hemorrhage of anus and rectum: Secondary | ICD-10-CM

## 2017-08-27 LAB — TYPE AND SCREEN
ABO/RH(D): A POS
ANTIBODY SCREEN: NEGATIVE

## 2017-08-27 LAB — CBC WITH DIFFERENTIAL/PLATELET
BASOS ABS: 0 10*3/uL (ref 0.0–0.1)
BASOS PCT: 0 %
EOS ABS: 0.1 10*3/uL (ref 0.0–0.7)
Eosinophils Relative: 4 %
HCT: 40.7 % (ref 39.0–52.0)
HEMOGLOBIN: 13.6 g/dL (ref 13.0–17.0)
LYMPHS ABS: 1 10*3/uL (ref 0.7–4.0)
Lymphocytes Relative: 28 %
MCH: 29.8 pg (ref 26.0–34.0)
MCHC: 33.4 g/dL (ref 30.0–36.0)
MCV: 89.1 fL (ref 78.0–100.0)
Monocytes Absolute: 0.3 10*3/uL (ref 0.1–1.0)
Monocytes Relative: 7 %
NEUTROS PCT: 61 %
Neutro Abs: 2.1 10*3/uL (ref 1.7–7.7)
Platelets: 160 10*3/uL (ref 150–400)
RBC: 4.57 MIL/uL (ref 4.22–5.81)
RDW: 13.6 % (ref 11.5–15.5)
WBC: 3.5 10*3/uL — ABNORMAL LOW (ref 4.0–10.5)

## 2017-08-27 LAB — COMPREHENSIVE METABOLIC PANEL
ALT: 13 U/L — AB (ref 17–63)
AST: 16 U/L (ref 15–41)
Albumin: 3.2 g/dL — ABNORMAL LOW (ref 3.5–5.0)
Alkaline Phosphatase: 59 U/L (ref 38–126)
Anion gap: 10 (ref 5–15)
BUN: 25 mg/dL — ABNORMAL HIGH (ref 6–20)
CALCIUM: 8.4 mg/dL — AB (ref 8.9–10.3)
CHLORIDE: 105 mmol/L (ref 101–111)
CO2: 27 mmol/L (ref 22–32)
CREATININE: 0.83 mg/dL (ref 0.61–1.24)
GFR calc Af Amer: >60 mL/min (ref >60)
GFR calc non Af Amer: >60 mL/min (ref >60)
Glucose, Bld: 92 mg/dL (ref 65–99)
Potassium: 3.7 mmol/L (ref 3.5–5.1)
Sodium: 142 mmol/L (ref 135–145)
Total Bilirubin: 0.8 mg/dL (ref 0.3–1.2)
Total Protein: 6.6 g/dL (ref 6.5–8.1)

## 2017-08-27 LAB — ABO/RH: ABO/RH(D): A POS

## 2017-08-27 LAB — POC OCCULT BLOOD, ED: FECAL OCCULT BLD: POSITIVE — AB

## 2017-08-27 MED ORDER — HYDROCORTISONE ACETATE 25 MG RE SUPP: 25.0000 mg | Freq: Every day | RECTAL | 0 refills | Status: DC

## 2017-08-27 NOTE — ED Triage Notes (Signed)
Pt complains of blood in stool for the past 3 days. Pt states blood is bright red. Pt denies pain. Pt has hx of prostate cancer.

## 2017-08-27 NOTE — ED Notes (Signed)
TWO UNSUCCESSFUL LAB COLLECTION ATTEMPTS 

## 2017-08-27 NOTE — ED Provider Notes (Signed)
[facility] DEPT Provider Note   CSN: [id number] Arrival date & time: 08/27/17  5366     History   Chief Complaint Chief Complaint  Patient presents with  . Rectal Bleeding    HPI Joshua Cain is a 72 y.o. male.  Patient is a 72 year old male with a history of hypertension, prostate cancer and hyperlipidemia who presents with rectal bleeding.  He reports a 3-day history of blood in his stool.  He reports blood mixed with stool and blood when he wipes.  He states the bleeding was worse this morning.  He denies any associated abdominal pain.  No rectal pain.  No dizziness or shortness of breath.  No nausea or vomiting.  He states he had a history of similar bleeding in the past when he was diagnosed with prostate cancer.  He has had colonoscopies which have been done through the [facility] hospital.  He does not have a gastroenterologist in [place].     Past Medical History:  Diagnosis Date  . Hypertension   . Prostate cancer [facility])     Patient Active Problem List   Diagnosis Date Noted  . Malignant neoplasm of prostate ([place]) 05/06/2016  . HAND PAIN, RIGHT 03/03/2010  . CONTUSION, HEAD 03/03/2010  . 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 URI 06/12/2008  . HYPERLIPIDEMIA 03/05/2008  . PVD 03/05/2008  . ERECTILE DYSFUNCTION 01/04/2007  . HYPERTENSION 01/04/2007    Past Surgical History:  Procedure Laterality Date  . COLON SURGERY     R/T infection  . PROSTATE BIOPSY    . veinous graft from leg to wrist          Home Medications    Prior to Admission medications   Medication Sig Start Date End Date Taking? Authorizing Provider  aspirin 81 MG tablet Take 160 mg by mouth daily.   Yes [provider]  cetirizine (ZYRTEC) 10 MG tablet Take 10 mg by mouth daily.   Yes [provider]  cyclobenzaprine (FLEXERIL) 10 MG tablet Take 10 mg by mouth at bedtime.   Yes [provider]  docusate sodium (COLACE) 100 MG  capsule Take 100 mg by mouth 2 (two) times daily.   Yes [provider]  gabapentin (NEURONTIN) 400 MG capsule Take 800 mg by mouth at bedtime.    Yes [provider]  lisinopril-hydrochlorothiazide (PRINZIDE,ZESTORETIC) 20-12.5 MG tablet Take 1 tablet by mouth daily.   Yes [provider]  meloxicam (MOBIC) 15 MG tablet Take 15 mg by mouth at bedtime.   Yes [provider]  Multiple Vitamins-Minerals (MULTIVITAMIN PO) Take by mouth.   Yes [provider]  Trolamine Salicylate (ASPERCREME EX) Apply 1 application topically daily as needed (leg pain).   Yes [provider]  HYDROCHLOROTHIAZIDE PO Take by mouth.    [provider]  hydrocortisone (ANUSOL-HC) 25 MG suppository Place 1 suppository (25 mg total) rectally at bedtime. For 7 days 08/27/17   Malvin Johns, MD    Family History Family History  Problem Relation Age of Onset  . Cancer Father        prostate  . Cancer Paternal Uncle        unknown  . Cancer Son        bones    Social History Social History   Tobacco Use  . Smoking status: Never Smoker  . Smokeless tobacco: Never Used  Substance Use Topics  . Alcohol use: No  . Drug use: No  Allergies   Patient has no known allergies.   Review of Systems Review of Systems  Constitutional: Negative for chills, diaphoresis, fatigue and fever.  HENT: Negative for congestion, rhinorrhea and sneezing.   Eyes: Negative.   Respiratory: Negative for cough, chest tightness and shortness of breath.   Cardiovascular: Negative for chest pain and leg swelling.  Gastrointestinal: Positive for blood in stool. Negative for abdominal pain, diarrhea, nausea and vomiting.  Genitourinary: Negative for difficulty urinating, flank pain, frequency and hematuria.  Musculoskeletal: Negative for arthralgias and back pain.  Skin: Negative for rash.  Neurological: Negative for dizziness, speech difficulty, weakness, numbness and  headaches.     Physical Exam Updated Vital Signs BP (!) 152/92 (BP Location: Right Arm)   Pulse 89   Temp 97.9 F (36.6 C) (Oral)   Resp 16   SpO2 96%   Physical Exam  Constitutional: He is oriented to person, place, and time. He appears well-developed and well-nourished.  HENT:  Head: Normocephalic and atraumatic.  Eyes: Pupils are equal, round, and reactive to light.  Neck: Normal range of motion. Neck supple.  Cardiovascular: Normal rate, regular rhythm and normal heart sounds.  Pulmonary/Chest: Effort normal and breath sounds normal. No respiratory distress. He has no wheezes. He has no rales. He exhibits no tenderness.  Abdominal: Soft. Bowel sounds are normal. There is no tenderness. There is no rebound and no guarding.  Genitourinary:  Genitourinary Comments: Positive brown stool with some gross blood on rectal exam.  Musculoskeletal: Normal range of motion. He exhibits no edema.  Lymphadenopathy:    He has no cervical adenopathy.  Neurological: He is alert and oriented to person, place, and time.  Skin: Skin is warm and dry. No rash noted.  Psychiatric: He has a normal mood and affect.     ED Treatments / Results  Labs (all labs ordered are listed, but only abnormal results are displayed) Labs Reviewed  COMPREHENSIVE METABOLIC PANEL - Abnormal; Notable for the following components:      Result Value   BUN 25 (*)    Calcium 8.4 (*)    Albumin 3.2 (*)    ALT 13 (*)    All other components within normal limits  CBC WITH DIFFERENTIAL/PLATELET - Abnormal; Notable for the following components:   WBC 3.5 (*)    All other components within normal limits  POC OCCULT BLOOD, ED - Abnormal; Notable for the following components:   Fecal Occult Bld POSITIVE (*)    All other components within normal limits  TYPE AND SCREEN  ABO/RH    EKG EKG Interpretation  Date/Time:  Friday Aug 27 2017 10:17:26 EDT Ventricular Rate:  80 PR Interval:    QRS Duration: 94 QT  Interval:  396 QTC Calculation: 457 R Axis:   26 Text Interpretation:  Sinus rhythm Abnormal R-wave progression, early transition since last tracing no significant change Confirmed by Malvin Johns [id number]) on 08/27/2017 11:11:19 AM   Radiology No results found.  Procedures Procedures (including critical care time)  Medications Ordered in ED Medications - No data to display   Initial Impression / Assessment and Plan / ED Course  I have reviewed the triage vital signs and the nursing notes.  Pertinent labs & imaging results that were available during my care of the patient were reviewed by me and considered in my medical decision making (see chart for details).     Patient is a 72 year old male who presents with rectal bleeding.  He has had no  episodes of bleeding since he has been in the emergency department although he did have some small amount of gross blood on rectal exam that was mixed with stool.  His hemoglobin is normal.  His vital signs are normal.  I initially spoke with the hospitalist about admitting him for observation although they felt that since it has been going on for 3 days and his hemoglobin is stable that he possibly could be discharged.  They recommended consulting with GI.  I spoke with the PA, Janett Billow with [facility] gastroenterology who feels that patient can be discharged and they have made the patient an appointment on Tuesday which is in 3 days for follow-up in their office.  Patient is agreeable to this plan.  I did give him strict return precautions to return if he has any heavier bleeding, abdominal pain, dizziness, weakness or other worsening symptoms.  The GI PA also recommended to start him on hydrocortisone suppositories at bedtime in case he has an internal hemorrhoid that may be bleeding.  Final Clinical Impressions(s) / ED Diagnoses   Final diagnoses:  Rectal bleeding    ED Discharge Orders        Ordered    hydrocortisone (ANUSOL-HC) 25 MG  suppository  Daily at bedtime     08/27/17 1158       Malvin Johns, MD 08/27/17 1210

## 2017-08-31 ENCOUNTER — Ambulatory Visit (INDEPENDENT_AMBULATORY_CARE_PROVIDER_SITE_OTHER): Payer: Medicare Other | Admitting: Physician Assistant

## 2017-08-31 ENCOUNTER — Encounter: Payer: Self-pay | Admitting: Physician Assistant

## 2017-08-31 VITALS — BP 126/72 | HR 87 | Ht 71.0 in | Wt 199.4 lb

## 2017-08-31 DIAGNOSIS — K648 Other hemorrhoids: Secondary | ICD-10-CM

## 2017-08-31 DIAGNOSIS — K625 Hemorrhage of anus and rectum: Secondary | ICD-10-CM | POA: Diagnosis not present

## 2017-08-31 MED ORDER — HYDROCORTISONE ACETATE 25 MG RE SUPP: RECTAL | 0 refills | Status: AC

## 2017-08-31 NOTE — Patient Instructions (Signed)
If you are age 72 or older, your body mass index should be between 23-30. Your Body mass index is 27.81 kg/m. If this is out of the aforementioned range listed, please consider follow up with your Primary Care Provider.  We have provided you a printed prescription to take to the [place].  Anusol HC suppositories.

## 2017-08-31 NOTE — Progress Notes (Addendum)
Chief Complaint: Rectal bleeding  HPI:     Joshua Cain is a 72 year old male with a past medical history as listed below, Known to Dr. Celso Amy presents to clinic today as a referral after being seen in the ED, for rectal bleeding.    07/04/03 colonoscopy with Dr. Sharlett Iles with finding of a 3 millimeter polyp in the sigmoid colon removed. Pathology revealed tubular adenoma. [facility]    08/27/17 ED visit with a three-day history of blood in his stool. Reported history of colonoscopies done at the [facility]. Rectal exam revealed positive brown stool with some gross blood on rectal exam. CMP, CBC were normal. Fecal occult blood was positive. They verbally consulted Alonza Bogus, PA who recommended patient to be discharged and have appointment with [place]. Also recommended he start Hydrocortisone suppositories at bedtime in case it was internal hemorrhoids bleeding.   Today, presents with wife, explains he has been using Hydrocortisone suppositories daily at bedtime 4 days and has seen no further bleeding, has three days left. Prior to this bleeding episode he first started seeing bright red blood in his bowel movement on 08/25/17 and describes that the first time this occurred it was with a very hard stool. Typically he takes a daily stool softener which helps with this. The 2 days after that he had blood with a bowel movement on both days, since then has had multiple bowel movements with no bleeding. Denies any rectal pain, abdominal pain or change in bowel habits.    Patient follows the [facility] for surveillance colonoscopies and believes his last one was in within the past 2-3 years. He would like to continue to follow with them.    Denies fever, chills, weight loss, anorexia, nausea, vomiting, heartburn or reflux.  Past Medical History:  Diagnosis Date  . Hypertension   . Prostate cancer [facility])     Past Surgical History:  Procedure Laterality Date  . COLON SURGERY      R/T infection  . PROSTATE BIOPSY    . veinous graft from leg to wrist      Current Outpatient Medications  Medication Sig Dispense Refill  . aspirin 81 MG tablet Take 160 mg by mouth daily.    . cetirizine (ZYRTEC) 10 MG tablet Take 10 mg by mouth daily.    . cyclobenzaprine (FLEXERIL) 10 MG tablet Take 10 mg by mouth at bedtime.    . docusate sodium (COLACE) 100 MG capsule Take 100 mg by mouth 2 (two) times daily.    Marland Kitchen gabapentin (NEURONTIN) 400 MG capsule Take 800 mg by mouth at bedtime.     Marland Kitchen HYDROCHLOROTHIAZIDE PO Take by mouth.    . hydrocortisone (ANUSOL-HC) 25 MG suppository Place 1 suppository (25 mg total) rectally at bedtime. For 7 days 7 suppository 0  . lisinopril-hydrochlorothiazide (PRINZIDE,ZESTORETIC) 20-12.5 MG tablet Take 1 tablet by mouth daily.    . meloxicam (MOBIC) 15 MG tablet Take 15 mg by mouth at bedtime.    . Multiple Vitamins-Minerals (MULTIVITAMIN PO) Take by mouth.    Loura Pardon Salicylate (ASPERCREME EX) Apply 1 application topically daily as needed (leg pain).     No current facility-administered medications for this visit.     Allergies as of 08/31/2017  . (No Known Allergies)    Family History  Problem Relation Age of Onset  . Cancer Father        prostate  . Cancer Paternal Uncle  unknown  . Cancer Son        bones    Social History   Socioeconomic History  . Marital status: Married    Spouse name: Not on file  . Number of children: Not on file  . Years of education: Not on file  . Highest education level: Not on file  Occupational History  . Not on file  Social Needs  . Financial resource strain: Not on file  . Food insecurity:    Worry: Not on file    Inability: Not on file  . Transportation needs:    Medical: Not on file    Non-medical: Not on file  Tobacco Use  . Smoking status: Never Smoker  . Smokeless tobacco: Never Used  Substance and Sexual Activity  . Alcohol use: No  . Drug use: No  . Sexual  activity: Not Currently  Lifestyle  . Physical activity:    Days per week: Not on file    Minutes per session: Not on file  . Stress: Not on file  Relationships  . Social connections:    Talks on phone: Not on file    Gets together: Not on file    Attends religious service: Not on file    Active member of club or organization: Not on file    Attends meetings of clubs or organizations: Not on file    Relationship status: Not on file  . Intimate partner violence:    Fear of current or ex partner: Not on file    Emotionally abused: Not on file    Physically abused: Not on file    Forced sexual activity: Not on file  Other Topics Concern  . Not on file  Social History Narrative  . Not on file    Review of Systems:    Constitutional: No weight loss, fever or chills Skin: No rash  Cardiovascular: No chest pain Respiratory: No SOB  Gastrointestinal: See HPI and otherwise negative Genitourinary: No dysuria Neurological: No headache, dizziness or syncope Musculoskeletal: No new muscle or joint pain Hematologic: No bruising Psychiatric: No history of depression or anxiety   Physical Exam:  Vital signs: BP 126/72   Pulse 87   Ht 5\' 11"  (1.803 m)   Wt 199 lb 6.4 oz (90.4 kg)   BMI 27.81 kg/m    Constitutional:   Pleasant AA male appears to be in NAD, Well developed, Well nourished, alert and cooperative Head:  Normocephalic and atraumatic. Eyes:   PEERL, EOMI. No icterus. Conjunctiva pink. Ears:  Normal auditory acuity. Neck:  Supple Throat: Oral cavity and pharynx without inflammation, swelling or lesion.  Respiratory: Respirations even and unlabored. Lungs clear to auscultation bilaterally.   No wheezes, crackles, or rhonchi.  Cardiovascular: Normal S1, S2. No MRG. Regular rate and rhythm. No peripheral edema, cyanosis or pallor.  Gastrointestinal:  Soft, nondistended, nontender. No rebound or guarding. Normal bowel sounds. No appreciable masses or hepatomegaly. Rectal:   External: small excoriations near anterior rectum, Internal: no pain, brown stool; Anoscopy: Grade I internal hemorrhoids with visible signs of recent bleeding Msk:  Symmetrical without gross deformities. Without edema, no deformity or joint abnormality.  Neurologic:  Alert and  oriented x4;  grossly normal neurologically.  Skin:   Dry and intact without significant lesions or rashes. Psychiatric: Demonstrates good judgement and reason without abnormal affect or behaviors.  RELEVANT LABS AND IMAGING: CBC    Component Value Date/Time   WBC 3.5 (L) 08/27/2017 1018   RBC  4.57 08/27/2017 1018   HGB 13.6 08/27/2017 1018   HCT 40.7 08/27/2017 1018   PLT 160 08/27/2017 1018   MCV 89.1 08/27/2017 1018   MCH 29.8 08/27/2017 1018   MCHC 33.4 08/27/2017 1018   RDW 13.6 08/27/2017 1018   LYMPHSABS 1.0 08/27/2017 1018   MONOABS 0.3 08/27/2017 1018   EOSABS 0.1 08/27/2017 1018   BASOSABS 0.0 08/27/2017 1018    CMP     Component Value Date/Time   NA 142 08/27/2017 1018   K 3.7 08/27/2017 1018   CL 105 08/27/2017 1018   CO2 27 08/27/2017 1018   GLUCOSE 92 08/27/2017 1018   BUN 25 (H) 08/27/2017 1018   CREATININE 0.83 08/27/2017 1018   CALCIUM 8.4 (L) 08/27/2017 1018   PROT 6.6 08/27/2017 1018   ALBUMIN 3.2 (L) 08/27/2017 1018   AST 16 08/27/2017 1018   ALT 13 (L) 08/27/2017 1018   ALKPHOS 59 08/27/2017 1018   BILITOT 0.8 08/27/2017 1018   GFRNONAA >60 08/27/2017 1018   GFRAA >60 08/27/2017 1018    Assessment: 1. Rectal bleeding: For 3 days with a bowel movement, initially started with a hard stool, internal hemorrhoids on exam with signs of bleeding, up-to-date on colonoscopies at the [facility] per patient; likely internal hemorrhoids 2. History of tubular adenoma colon: last colonoscopy by Dr. Sharlett Iles here in 2005, since then has followed at the [facility]: 1. Continue Anusol suppositories daily at bedtime 3 more days. Discussed with patient that if he sees any further bleeding in the  next 2-3 months after that, recommend he use Anusol suppositories twice a day 7 days. (did provide him with script today to fill at [facility] if needed ) If he continues past that time, [facility]. 2. Encouraged the patient to check in with the [facility] now to ensure that he is not due for a colonoscopy. 3. Patient to follow in clinic with [place] as needed in the future. Was assigned to Dr. Hilarie Fredrickson.  Ellouise Newer, PA-C [facility] 08/31/2017, 2:03 PM  Cc: Biagio Borg, MD   Addendum: Reviewed and agree with initial management. Agree with plans for further investigation if persist symptoms and surveillance colonoscopies based on previous findings at examinations with the [facility] Py[place]le, Lajuan Lines, MD

## 2018-04-09 DIAGNOSIS — R112 Nausea with vomiting, unspecified: Secondary | ICD-10-CM | POA: Diagnosis not present

## 2018-04-09 DIAGNOSIS — R197 Diarrhea, unspecified: Secondary | ICD-10-CM | POA: Diagnosis not present

## 2018-04-09 DIAGNOSIS — G459 Transient cerebral ischemic attack, unspecified: Secondary | ICD-10-CM | POA: Diagnosis not present

## 2018-04-09 DIAGNOSIS — G453 Amaurosis fugax: Secondary | ICD-10-CM | POA: Diagnosis not present

## 2018-04-09 DIAGNOSIS — Z8546 Personal history of malignant neoplasm of prostate: Secondary | ICD-10-CM | POA: Diagnosis not present

## 2018-04-09 DIAGNOSIS — I10 Essential (primary) hypertension: Secondary | ICD-10-CM | POA: Diagnosis not present

## 2018-04-09 DIAGNOSIS — R109 Unspecified abdominal pain: Secondary | ICD-10-CM | POA: Diagnosis not present

## 2018-04-09 DIAGNOSIS — H53132 Sudden visual loss, left eye: Secondary | ICD-10-CM | POA: Diagnosis not present

## 2018-04-09 DIAGNOSIS — I739 Peripheral vascular disease, unspecified: Secondary | ICD-10-CM | POA: Diagnosis not present

## 2018-08-04 ENCOUNTER — Emergency Department (HOSPITAL_BASED_OUTPATIENT_CLINIC_OR_DEPARTMENT_OTHER): Payer: No Typology Code available for payment source

## 2018-08-04 ENCOUNTER — Other Ambulatory Visit: Payer: Self-pay

## 2018-08-04 ENCOUNTER — Encounter (HOSPITAL_COMMUNITY): Payer: Self-pay

## 2018-08-04 ENCOUNTER — Emergency Department (HOSPITAL_COMMUNITY)
Admission: EM | Admit: 2018-08-04 | Discharge: 2018-08-04 | Disposition: A | Discharge status: Home / Self Care | Payer: No Typology Code available for payment source | Attending: Emergency Medicine | Admitting: Emergency Medicine

## 2018-08-04 DIAGNOSIS — I10 Essential (primary) hypertension: Secondary | ICD-10-CM | POA: Insufficient documentation

## 2018-08-04 DIAGNOSIS — M79609 Pain in unspecified limb: Secondary | ICD-10-CM

## 2018-08-04 DIAGNOSIS — M79604 Pain in right leg: Secondary | ICD-10-CM

## 2018-08-04 DIAGNOSIS — Z7982 Long term (current) use of aspirin: Secondary | ICD-10-CM | POA: Diagnosis not present

## 2018-08-04 DIAGNOSIS — Z79899 Other long term (current) drug therapy: Secondary | ICD-10-CM | POA: Diagnosis not present

## 2018-08-04 NOTE — ED Provider Notes (Addendum)
[facility] EMERGENCY DEPARTMENT Provider Note   CSN: [id number] Arrival date & time: 08/04/18  1102    History   Chief Complaint Chief Complaint  Patient presents with   Leg Pain    HPI Joshua Cain is a 73 y.o. male presenting today for 1 month of right leg pain.  Patient reports that pain began gradually and has been constant since onset describes a mild aching throb constant worse in the right medial upper thigh and without alleviating factors.  Pain worsened by ambulation and palpation.  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  Unable to find record in patient's chart of this event.  Patient denies any fall, injury or trauma.  Denies any swelling of the leg or color change.     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 Authorizing Provider  aspirin 81 MG tablet Take 160 mg by mouth daily.    [provider]  cetirizine (ZYRTEC) 10 MG tablet Take 10 mg by mouth daily.    [provider]  cyclobenzaprine (FLEXERIL) 10 MG tablet Take 10 mg by mouth at bedtime.    [provider]  docusate sodium (COLACE) 100 MG capsule Take 100 mg by mouth 2  (two) times daily.    [provider]  gabapentin (NEURONTIN) 400 MG capsule Take 800 mg by mouth at bedtime.     [provider]  HYDROCHLOROTHIAZIDE PO Take by mouth.    [provider]  hydrocortisone (ANUSOL-HC) 25 MG suppository Place 1 suppository rectally, twice daily for 7 days. Expires 10-31-2017. 08/31/17   Levin Erp, PA  lisinopril-hydrochlorothiazide (PRINZIDE,ZESTORETIC) 20-12.5 MG tablet Take 1 tablet by mouth daily.    [provider]  meloxicam (MOBIC) 15 MG tablet Take 15 mg by mouth at bedtime.    [provider]  Multiple Vitamins-Minerals (MULTIVITAMIN PO) Take by mouth.    [provider]  Trolamine Salicylate (ASPERCREME EX) Apply 1 application topically daily as needed (leg pain).    [provider]    Family History Family History  Problem Relation Age of Onset   Cancer Father        prostate   Cancer Paternal Uncle        unknown   Cancer Son        bones   Colon cancer Neg Hx     Social History Social History   Tobacco Use   Smoking status: Never Smoker   Smokeless tobacco: Never Used  Substance Use Topics   Alcohol use: No   Drug use: No  Allergies   Patient has no known allergies.   Review of Systems Review of Systems  Constitutional: Negative.  Negative for chills and fever.  Respiratory: Negative.  Negative for cough and shortness of breath.   Cardiovascular: Negative.  Negative for chest pain.  Musculoskeletal: Positive for myalgias (Right medial thigh). Negative for arthralgias.  Skin: Negative.  Negative for color change and wound.  Neurological: Negative.  Negative for weakness and numbness.  All other systems reviewed and are negative.    Physical Exam Updated Vital Signs BP (!) 147/79 (BP Location: Right Arm)    Pulse 88    Temp 98.4 F (36.9 C) (Oral)    Resp 16    Ht 5\' 11"  (1.803 m)    Wt 89.8 kg    SpO2 100%    BMI 27.62 kg/m   Physical  Exam Constitutional:      General: He is not in acute distress.    Appearance: Normal appearance. He is well-developed. He is not ill-appearing or diaphoretic.  HENT:     Head: Normocephalic and atraumatic.     Right Ear: External ear normal.     Left Ear: External ear normal.     Nose: Nose normal.  Eyes:     General: Vision grossly intact. Gaze aligned appropriately.     Pupils: Pupils are equal, round, and reactive to light.  Neck:     Musculoskeletal: Normal range of motion.     Trachea: Trachea and phonation normal. No tracheal deviation.  Cardiovascular:     Pulses:          Dorsalis pedis pulses are 1+ on the right side and 1+ on the left side.       Posterior tibial pulses are 1+ on the right side and 1+ on the left side.  Pulmonary:     Effort: Pulmonary effort is normal. No respiratory distress.  Abdominal:     General: There is no distension.     Palpations: Abdomen is soft. There is no pulsatile mass.     Tenderness: There is no abdominal tenderness. There is no guarding or rebound.  Musculoskeletal: Normal range of motion.     Right hip: Normal.     Left hip: Normal.     Right knee: Normal.     Left knee: Normal.     Right ankle: Normal.     Left ankle: Normal.     Right upper leg: He exhibits tenderness. He exhibits no swelling, no edema and no deformity.     Left upper leg: Normal.     Right lower leg: Normal. He exhibits no tenderness. No edema.     Left lower leg: Normal. He exhibits no tenderness. No edema.       Legs:  Feet:     Right foot:     Protective Sensation: 5 sites tested. 5 sites sensed.     Skin integrity: Skin integrity normal.     Left foot:     Protective Sensation: 5 sites tested. 5 sites sensed.     Skin integrity: Skin integrity normal.  Skin:    General: Skin is warm and dry.  Neurological:     Mental Status: He is alert.     GCS: GCS eye subscore is 4. GCS verbal subscore is 5. GCS motor subscore is 6.     Comments: Speech is  clear and goal oriented, follows commands Major Cranial nerves without deficit, no facial droop Moves extremities without ataxia, coordination  intact  Psychiatric:        Behavior: Behavior normal.      ED Treatments / Results  Labs (all labs ordered are listed, but only abnormal results are displayed) Labs Reviewed - No data to display  EKG None  Radiology Vas [place] Lower Extremity Venous (dvt) (only [facility])  Result Date: 08/04/2018  Lower Venous Study Indications: Pain.  Performing Technologist: June Leap RDMS, RVT  Examination Guidelines: A complete evaluation includes B-mode imaging, spectral Doppler, color Doppler, and power Doppler as needed of all accessible portions of each vessel. Bilateral testing is considered an integral part of a complete examination. Limited examinations for reoccurring indications may be performed as noted.  +---------+---------------+---------+-----------+----------+-------+  RIGHT     Compressibility Phasicity Spontaneity Properties Summary  +---------+---------------+---------+-----------+----------+-------+  CFV       Full            Yes       Yes                             +---------+---------------+---------+-----------+----------+-------+  SFJ       Full                                                      +---------+---------------+---------+-----------+----------+-------+  FV Prox   Full                                                      +---------+---------------+---------+-----------+----------+-------+  FV Mid    Full                                                      +---------+---------------+---------+-----------+----------+-------+  FV Distal Full                                                      +---------+---------------+---------+-----------+----------+-------+  PFV       Full                                                      +---------+---------------+---------+-----------+----------+-------+  POP       Full            Yes       Yes                              +---------+---------------+---------+-----------+----------+-------+  PTV       Full                                                      +---------+---------------+---------+-----------+----------+-------+  PERO      Full                                                      +---------+---------------+---------+-----------+----------+-------+     Summary: Right: There is no evidence of deep vein thrombosis in the lower extremity. No cystic structure found in the popliteal fossa.  *See table(s) above for measurements and observations.    Preliminary     Procedures Procedures (including critical care time)  Medications Ordered in ED Medications - No data to display   Initial Impression / Assessment and Plan / ED Course  I have reviewed the triage vital signs and the nursing notes.  Pertinent labs & imaging results that were available during my care of the patient were reviewed by me and considered in my medical decision making (see chart for details).  Clinical Course as of Aug 03 1356  Thu Aug 04, 2018  1344 PCP follow-up tylenol/motrin.   [BM]    Clinical Course User Index [BM] Nuala Alpha A, PA-C   Bilateral lower extremities extremity neurovascularly intact; no signs of infection, septic joint, DVT, compartment syndrome. Patient with great range of motion and strength with all movements of bilateral lower extremities for age.  Pedal pulses intact and equal bilaterally 1+, additionally dopplered for confirmation.  Right lower extremity ultrasound negative for DVT  Suspect musculoskeletal etiology of patient's pain do not suspect acute arterial/venous or other emergent etiologies at this time.  Patient denies history of CKD or gastric ulcers, no history of liver disease.  Most recent lab work reviewed without elevated creatinine.  Plan to discharge at this time with PCP follow-up, Tylenol and Motrin OTC for symptom control.  At this time there does not  appear to be any evidence of an acute emergency medical condition and the patient appears stable for discharge with appropriate outpatient follow up. Diagnosis was discussed with patient who verbalizes understanding of care plan and is agreeable to discharge. I have discussed return precautions with patient who verbalizes understanding of return precautions. Patient encouraged to follow-up with their PCP. All questions answered.  Patient was seen and evaluated by Dr. Reather Converse during this visit with discharge at this time with OTC Tylenol/Motrin and PCP follow-up.  Note: Portions of this report may have been transcribed using voice recognition software. Every effort was made to ensure accuracy; however, inadvertent computerized transcription errors may still be present. Final Clinical Impressions(s) / ED Diagnoses   Final diagnoses:  Right leg pain    ED Discharge Orders    None       Deliah Boston, PA-C 08/04/18 1426    Gari Crown 08/04/18 1432    Elnora Morrison, MD 08/04/18 1620

## 2018-08-04 NOTE — Discharge Instructions (Addendum)
You have been diagnosed today with right leg pain  At this time there does not appear to be the presence of an emergent medical condition, however there is always the potential for conditions to change. Please read and follow the below instructions.  Please return to the Emergency Department immediately for any new or worsening symptoms. Please be sure to follow up with your Primary Care Provider within one week regarding your visit today; please call their office to schedule an appointment even if you are feeling better for a follow-up visit. Please call your primary care provider's office today to schedule a follow-up appointment regarding your right leg pain. Please take Ibuprofen (Advil, motrin) and Tylenol (acetaminophen) to relieve your pain.  You may take up to 400 MG (2 pills) of normal strength ibuprofen every 8 hours as needed.  In between doses of ibuprofen you make take tylenol, up to 500 mg (one extra strength pill).  Do not take more than 3,000 mg tylenol in a 24 hour period.  Please check all medication labels as many medications such as pain and cold medications may contain tylenol.  Do not drink alcohol while taking these medications.  Do not take other NSAID'S while taking ibuprofen (such as aleve or naproxen).  Please take ibuprofen with food to decrease stomach upset.  Get help right away if: You have a new injury and your pain is worse or different. You feel numb or you have tingling in the painful area. You have chest pain or shortness of breath You have fever Any new/concerning or worsening symptoms.   Please read the additional information packets attached to your discharge summary.  Do not take your medicine if  develop an itchy rash, swelling in your mouth or lips, or difficulty breathing.

## 2018-08-04 NOTE — ED Provider Notes (Signed)
Shared service with APP.  I have personally seen and examined the patient, providing direct face to face care.  Physical exam findings and plan include patient presents with 1 month of right leg pain, worse with movement.  Patient had surgery years ago unsure where.  On exam patient has no sign of external infection, neurovascularly intact right leg.  Ultrasound revealed no blood clot.  Patient stable for close outpatient follow-up.  No diagnosis found.     Elnora Morrison, MD 08/04/18 1620

## 2018-08-04 NOTE — ED Triage Notes (Signed)
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

## 2018-08-17 ENCOUNTER — Telehealth: Payer: Self-pay | Admitting: Radiation Oncology

## 2018-08-17 NOTE — Telephone Encounter (Signed)
Received voicemail message from patient requesting a return call. Phoned patient back. Noted patient was treated by Dr. Tammi Klippel in 2018 for prostate ca. Patient states, "I am feeling some kind of way and want to make an appointment with Dr. Tammi Klippel." Patient unable to describe in what way he is feeling. Patient denies seeing his [place] physician or Dr. Alinda Money recently. Encouraged patient to follow up with Dr. Alinda Money if he is having urinary symptoms or even his [place] doctor. Provided patient with Dr. Lynne Logan office number. Reassured patient Dr. Tammi Klippel would be glad to see him if radiation is needed but work up needed to be through Dr. Alinda Money or the [place]. Patient verbalized understanding and expressed appreciation for the return call.

## 2018-08-24 DIAGNOSIS — M1611 Unilateral primary osteoarthritis, right hip: Secondary | ICD-10-CM | POA: Diagnosis not present

## 2018-08-31 DIAGNOSIS — M1611 Unilateral primary osteoarthritis, right hip: Secondary | ICD-10-CM | POA: Diagnosis not present

## 2018-09-06 DIAGNOSIS — M1611 Unilateral primary osteoarthritis, right hip: Secondary | ICD-10-CM | POA: Diagnosis not present

## 2018-11-25 DIAGNOSIS — Z79899 Other long term (current) drug therapy: Secondary | ICD-10-CM | POA: Diagnosis not present

## 2018-11-25 DIAGNOSIS — Z0000 Encounter for general adult medical examination without abnormal findings: Secondary | ICD-10-CM | POA: Diagnosis not present

## 2019-01-30 ENCOUNTER — Other Ambulatory Visit: Payer: Self-pay

## 2019-01-30 DIAGNOSIS — Z20822 Contact with and (suspected) exposure to covid-19: Secondary | ICD-10-CM

## 2019-02-01 LAB — NOVEL CORONAVIRUS, NAA: SARS-CoV-2, NAA: NOT DETECTED

## 2019-12-26 ENCOUNTER — Ambulatory Visit: Payer: Medicare HMO | Attending: Internal Medicine

## 2019-12-26 DIAGNOSIS — Z23 Encounter for immunization: Secondary | ICD-10-CM

## 2019-12-26 NOTE — Progress Notes (Signed)
° °  Covid-19 Vaccination Clinic  Name:  Joshua Cain    MRN: [id number] DOB: [birth year]  12/26/2019  Joshua Cain was observed post Covid-19 immunization for 15 minutes without incident. He was provided with Vaccine Information Sheet and instruction to access the V-Safe system.   Joshua Cain was instructed to call 911 with any severe reactions post vaccine:  Difficulty breathing   Swelling of face and throat   A fast heartbeat   A bad rash all over body   Dizziness and weakness

## 2020-01-23 ENCOUNTER — Telehealth: Payer: Self-pay | Admitting: *Deleted

## 2020-01-23 NOTE — Telephone Encounter (Signed)
Patient states he has had problems with setting off metal detectors he wants to know if this is related to his surgery he had years ago. Patient was seen by Dr Trula Slade back in 2011 informed patient I would need to speak with Dr Trula Slade to get information regarding what type of procedure he had done and we would contact him when we had further information for him. He verbalized understanding.

## 2020-02-02 ENCOUNTER — Telehealth (HOSPITAL_COMMUNITY): Payer: Self-pay

## 2020-02-02 NOTE — Telephone Encounter (Signed)
02/02/20  I called and spoke to patient after further investigation with RN Ellsworth Lennox and Allyne Gee. Nothing that Dr Trula Slade did surgery on would cause a metal detector to alarm. We were able to find that patient had prostate cancer in 2018.  The radiologist Dr Tammi Klippel put in gold fiducial makers into the pancreas. That is metal. I called patient and explained in detail, he will call and follow up with Urologist Dr Alinda Money and Radiologist Dr Tammi Klippel. He verbalized that he understood.   [facility]

## 2020-02-02 NOTE — Telephone Encounter (Signed)
02/02/2020  Patient and his wife stopped by, patient needs a medical card stating what is in [place]. I explained to patient that Dr Trula Slade was not in the office, but would be on Monday. I gave him a card with my name and direct number to call and follow up on Monday 02/05/2020.   [facility]

## 2020-04-08 ENCOUNTER — Telehealth: Payer: Self-pay

## 2020-04-08 NOTE — Telephone Encounter (Signed)
Patient's wife last spoke to our office back in October regarding a medical card stating her husband had mesh that contains metal placed by [facility]. She states that she has spoken to Dr. Kathrynn Running (Radiologist)  and he state the gold seeds placed do NOT contain metal and the gold seed eventually dissolve over time to treat Prostate Cancer. She is requesting a call back to speak to someone regarding the materials used for his Surgery in 2011. She referred to material as "Mesh" and says the metal detectors only go off near the place where Dr. Myra Gianotti did surgery. Please Advise.

## 2020-04-09 ENCOUNTER — Telehealth: Payer: Self-pay | Admitting: *Deleted

## 2020-04-09 NOTE — Telephone Encounter (Signed)
Left message to call back regarding implants from 2010

## 2020-04-10 ENCOUNTER — Telehealth: Payer: Self-pay | Admitting: *Deleted

## 2020-04-10 NOTE — Telephone Encounter (Signed)
Spoke with pts wife about pts implants from 2010 that may or may not be setting off a metal detector. Patient is requesting information about implants to be sent to them in a letter. Will send list of implants and the materials they are made of to pt.  Implants: amplatzer plug (nitinol) , nestor coils (small amounts of platinum), and cook leg extension (stainless steel).

## 2023-06-04 ENCOUNTER — Ambulatory Visit
Admission: EM | Admit: 2023-06-04 | Discharge: 2023-06-04 | Disposition: A | Discharge status: Home / Self Care | Payer: Medicare HMO | Attending: Family Medicine | Admitting: Family Medicine

## 2023-06-04 ENCOUNTER — Ambulatory Visit (HOSPITAL_BASED_OUTPATIENT_CLINIC_OR_DEPARTMENT_OTHER)
Admission: RE | Admit: 2023-06-04 | Discharge: 2023-06-04 | Disposition: A | Discharge status: Home / Self Care | Payer: Medicare HMO | Source: Ambulatory Visit | Attending: Urgent Care | Admitting: Urgent Care

## 2023-06-04 DIAGNOSIS — R051 Acute cough: Secondary | ICD-10-CM | POA: Diagnosis present

## 2023-06-04 DIAGNOSIS — J069 Acute upper respiratory infection, unspecified: Secondary | ICD-10-CM

## 2023-06-04 DIAGNOSIS — B9689 Other specified bacterial agents as the cause of diseases classified elsewhere: Secondary | ICD-10-CM

## 2023-06-04 LAB — POC COVID19/FLU A&B COMBO
Covid Antigen, POC: NEGATIVE
Influenza A Antigen, POC: NEGATIVE
Influenza B Antigen, POC: NEGATIVE

## 2023-06-04 MED ORDER — AMOXICILLIN-POT CLAVULANATE 875-125 MG PO TABS: 1.0000 | ORAL_TABLET | Freq: Two times a day (BID) | ORAL | 0 refills | Status: AC

## 2023-06-04 MED ORDER — PROMETHAZINE-DM 6.25-15 MG/5ML PO SYRP: 5.0000 mL | ORAL_SOLUTION | Freq: Every evening | ORAL | 0 refills | Status: AC | PRN

## 2023-06-04 NOTE — Discharge Instructions (Signed)
 I have placed orders to have an x-ray done at the med center in [facility].  Please check in through the emergency room but do not register to be seen as a patient.  They will contact the radiology department and a staff member will take you to go get the x-ray done.   For now, please start Augmentin to help with a bacterial upper respiratory infection. I will update your diagnosis and treatment plan after I get the x-ray report either tonight or tomorrow morning.

## 2023-06-04 NOTE — ED Triage Notes (Signed)
 Pt states coughing for the past 2 weeks. Wheezing and SOB since yesterday.

## 2023-06-04 NOTE — ED Provider Notes (Signed)
 [facility] - [facility]  Note:  This document was prepared using Conservation officer, historic buildings and may include unintentional dictation errors.  MRN: [id number] DOB: 1946-02-01  Subjective:   Joshua Cain is a 78 y.o. male presenting for 2 week history of persistent coughing, shob, wheezing.  The past couple of days have been very difficult with coughing spells.  No official diagnosis of asthma.  No history of respiratory disorders.  No smoking.  Patient has a history of pneumonia and is concerned about that.  No current facility-administered medications for this encounter.  Current Outpatient Medications:    aspirin 81 MG tablet, Take 160 mg by mouth daily., Disp: , Rfl:    cetirizine (ZYRTEC) 10 MG tablet, Take 10 mg by mouth daily., Disp: , Rfl:    cyclobenzaprine (FLEXERIL) 10 MG tablet, Take 10 mg by mouth at bedtime., Disp: , Rfl:    docusate sodium (COLACE) 100 MG capsule, Take 100 mg by mouth 2 (two) times daily., Disp: , Rfl:    gabapentin (NEURONTIN) 400 MG capsule, Take 800 mg by mouth at bedtime. , Disp: , Rfl:    HYDROCHLOROTHIAZIDE PO, Take by mouth., Disp: , Rfl:    hydrocortisone (ANUSOL-HC) 25 MG suppository, Place 1 suppository rectally, twice daily for 7 days. Expires 10-31-2017., Disp: 14 suppository, Rfl: 0   lisinopril-hydrochlorothiazide (PRINZIDE,ZESTORETIC) 20-12.5 MG tablet, Take 1 tablet by mouth daily., Disp: , Rfl:    meloxicam (MOBIC) 15 MG tablet, Take 15 mg by mouth at bedtime., Disp: , Rfl:    Multiple Vitamins-Minerals (MULTIVITAMIN PO), Take by mouth., Disp: , Rfl:    Trolamine Salicylate (ASPERCREME EX), Apply 1 application topically daily as needed (leg pain)., Disp: , Rfl:    No Known Allergies  Past Medical History:  Diagnosis Date   Hypertension    Prostate cancer ([facility])      Past Surgical History:  Procedure Laterality Date   COLON SURGERY     R/T infection   PROSTATE BIOPSY     veinous graft from leg to wrist       Family History  Problem Relation Age of Onset   Cancer Father        prostate   Cancer Paternal Uncle        unknown   Cancer Son        bones   Colon cancer Neg Hx     Social History   Tobacco Use   Smoking status: Never   Smokeless tobacco: Never  Vaping Use   Vaping status: Never Used  Substance Use Topics   Alcohol use: No   Drug use: No    ROS   Objective:   Vitals: BP (!) 145/83 (BP Location: Left Arm)   Pulse (!) 109   Temp (!) 100.8 F (38.2 C) (Oral)   Resp 18   SpO2 91%   Physical Exam Constitutional:      General: He is not in acute distress.    Appearance: Normal appearance. He is well-developed and normal weight. He is not ill-appearing, toxic-appearing or diaphoretic.  HENT:     Head: Normocephalic and atraumatic.     Right Ear: Tympanic membrane, ear canal and external ear normal. No drainage, swelling or tenderness. No middle ear effusion. There is no impacted cerumen. Tympanic membrane is not erythematous or bulging.     Left Ear: Tympanic membrane, ear canal and external ear normal. No drainage, swelling or tenderness.  No middle ear effusion. There is no impacted  cerumen. Tympanic membrane is not erythematous or bulging.     Nose: Congestion present. No rhinorrhea.     Mouth/Throat:     Mouth: Mucous membranes are moist.     Pharynx: Posterior oropharyngeal erythema present. No oropharyngeal exudate.  Eyes:     General: No scleral icterus.       Right eye: No discharge.        Left eye: No discharge.     Extraocular Movements: Extraocular movements intact.     Conjunctiva/sclera: Conjunctivae normal.  Cardiovascular:     Rate and Rhythm: Normal rate and regular rhythm.     Heart sounds: Normal heart sounds. No murmur heard.    No friction rub. No gallop.  Pulmonary:     Effort: Pulmonary effort is normal. No respiratory distress.     Breath sounds: Normal breath sounds. No stridor. No wheezing, rhonchi or rales.  Musculoskeletal:      Cervical back: Normal range of motion and neck supple. No rigidity. No muscular tenderness.  Neurological:     General: No focal deficit present.     Mental Status: He is alert and oriented to person, place, and time.  Psychiatric:        Mood and Affect: Mood normal.        Behavior: Behavior normal.        Thought Content: Thought content normal.    Point-of-care influenza and COVID testing negative.  Assessment and Plan :   PDMP not reviewed this encounter.  1. Bacterial upper respiratory infection   2. Acute cough    I am pursuing an outpatient chest x-ray to rule out community-acquired pneumonia.  For now we will manage for bacterial upper respiratory infection with Augmentin.  Recommend supportive care.  Will update treatment plan once I receive the radiology over read.  Counseled patient on potential for adverse effects with medications prescribed/recommended today, ER and return-to-clinic precautions discussed, patient verbalized understanding.    Joshua Cain, [place] 06/04/23 1810
